# Patient Record
Sex: Female | Born: 1995 | Race: Black or African American | Hispanic: No | Marital: Single | Smoking: Former smoker
Health system: Southern US, Community
[De-identification: ages and names within clinical notes are randomized; demographics above are authoritative.]

## PROBLEM LIST (undated history)

## (undated) DIAGNOSIS — F419 Anxiety disorder, unspecified: Secondary | ICD-10-CM

---

## 2014-07-21 ENCOUNTER — Emergency Department (HOSPITAL_COMMUNITY)
Admission: EM | Admit: 2014-07-21 | Discharge: 2014-07-21 | Disposition: A | Discharge status: Home / Self Care | Payer: Self-pay | Attending: Emergency Medicine | Admitting: Emergency Medicine

## 2014-07-21 ENCOUNTER — Encounter (HOSPITAL_COMMUNITY): Payer: Self-pay | Admitting: Emergency Medicine

## 2014-07-21 DIAGNOSIS — J029 Acute pharyngitis, unspecified: Secondary | ICD-10-CM | POA: Insufficient documentation

## 2014-07-21 DIAGNOSIS — F17200 Nicotine dependence, unspecified, uncomplicated: Secondary | ICD-10-CM | POA: Insufficient documentation

## 2014-07-21 DIAGNOSIS — J069 Acute upper respiratory infection, unspecified: Secondary | ICD-10-CM | POA: Insufficient documentation

## 2014-07-21 DIAGNOSIS — B9789 Other viral agents as the cause of diseases classified elsewhere: Secondary | ICD-10-CM

## 2014-07-21 LAB — RAPID STREP SCREEN (MED CTR MEBANE ONLY): Streptococcus, Group A Screen (Direct): NEGATIVE

## 2014-07-21 MED ORDER — GUAIFENESIN ER 1200 MG PO TB12: 1.0000 | ORAL_TABLET | Freq: Two times a day (BID) | ORAL | Status: DC

## 2014-07-21 MED ORDER — IBUPROFEN 800 MG PO TABS: 800.0000 mg | ORAL_TABLET | Freq: Three times a day (TID) | ORAL | Status: DC | PRN

## 2014-07-21 MED ORDER — ACETAMINOPHEN-CODEINE 120-12 MG/5ML PO SOLN: 10.0000 mL | ORAL | Status: DC | PRN

## 2014-07-21 NOTE — Discharge Instructions (Signed)
Return here as needed.  Increase your fluid intake, rest as much as possible °

## 2014-07-21 NOTE — ED Provider Notes (Signed)
CSN: [id number]     Arrival date & time 07/21/14  1216 History  This chart was scribed for non-physician practitioner working with Toy Baker, MD by Elveria Rising, ED Scribe. This patient was seen in room WTR7/WTR7 and the patient's care was started at 1:42 PM.   Chief Complaint  Patient presents with  . Nasal Congestion  . Sore Throat  . Cough    The history is provided by the patient. No language interpreter was used.   HPI Comments: Angelica Dennis is a 18 y.o. female who presents to the Emergency Department complaining of cold symptoms including headache, rhinorrhea, sore throat, productive cough with green sputum, ongoing for two days. Patient reports treatment with Nyquil last night without relief. Patient reports subjective fever this morning; no treatment.  Recent sick contacts include her boyfriend who has had similar symptoms, ongoing for four days.   Patient denies chest pain, shortness of breath, abdominal pain or urinary symptoms.  Patient denies known allergies to medication.   History reviewed. No pertinent past medical history. History reviewed. No pertinent past surgical history. History reviewed. No pertinent family history. History  Substance Use Topics  . Smoking status: Current Every Day Smoker -- 0.50 packs/day    Types: Cigarettes  . Smokeless tobacco: Not on file  . Alcohol Use: Not on file   OB History   Grav Para Term Preterm Abortions TAB SAB Ect Mult Living                 Review of Systems A complete 10 system review of systems was obtained and all systems are negative except as noted in the HPI and PMH.    Allergies  Review of patient's allergies indicates no known allergies.  Home Medications   Prior to Admission medications   Not on File   Triage Vitals: BP 112/66  Pulse 87  Temp(Src) 99 F (37.2 C)  Resp 18  SpO2 96%  LMP 07/14/2014  Physical Exam  Nursing note and vitals reviewed. Constitutional: She is oriented to person,  place, and time. She appears well-developed and well-nourished. No distress.  HENT:  Head: Normocephalic and atraumatic.  Mouth/Throat: Oropharynx is clear and moist.  Eyes: Pupils are equal, round, and reactive to light.  Neck: Normal range of motion. Neck supple.  Mild cervical lymphadenopathy.   Cardiovascular: Normal rate, regular rhythm and normal heart sounds.  Exam reveals no gallop and no friction rub.   No murmur heard. Pulmonary/Chest: Effort normal and breath sounds normal. No respiratory distress. She has no wheezes. She has no rales.  Musculoskeletal: Normal range of motion.  Neurological: She is alert and oriented to person, place, and time.  Skin: Skin is warm and dry. She is not diaphoretic.  Psychiatric: She has a normal mood and affect. Her behavior is normal.    ED Course  Procedures (including critical care time)  COORDINATION OF CARE: 1:42 PM- Discussed treatment plan with patient at bedside and patient agreed to plan.   Labs Review Labs Reviewed  RAPID STREP SCREEN  CULTURE, GROUP A STREP    Patient be treated for viral URI.  Told to return here as needed.  Told to increase her fluid intake, and rest as much possible  I personally performed the services described in this documentation, which was scribed in my presence. The recorded information has been reviewed and is accurate.    Carlyle Dolly, PA-C 07/21/14 1407

## 2014-07-21 NOTE — ED Notes (Signed)
Pt c/o nasal congestion, sore throat, and productive cough w/ green mucous since Sunday.

## 2014-07-23 LAB — CULTURE, GROUP A STREP

## 2014-07-24 NOTE — ED Provider Notes (Signed)
Medical screening examination/treatment/procedure(s) were performed by non-physician practitioner and as supervising physician I was immediately available for consultation/collaboration.   Henry Utsey T Destenie Ingber, MD 07/24/14 0741 

## 2014-12-27 ENCOUNTER — Emergency Department (HOSPITAL_BASED_OUTPATIENT_CLINIC_OR_DEPARTMENT_OTHER)
Admission: EM | Admit: 2014-12-27 | Discharge: 2014-12-27 | Disposition: A | Discharge status: Home / Self Care | Payer: Self-pay | Attending: Emergency Medicine | Admitting: Emergency Medicine

## 2014-12-27 ENCOUNTER — Encounter (HOSPITAL_BASED_OUTPATIENT_CLINIC_OR_DEPARTMENT_OTHER): Payer: Self-pay | Admitting: *Deleted

## 2014-12-27 DIAGNOSIS — Z79899 Other long term (current) drug therapy: Secondary | ICD-10-CM | POA: Insufficient documentation

## 2014-12-27 DIAGNOSIS — Z720 Tobacco use: Secondary | ICD-10-CM | POA: Insufficient documentation

## 2014-12-27 DIAGNOSIS — H109 Unspecified conjunctivitis: Secondary | ICD-10-CM | POA: Insufficient documentation

## 2014-12-27 MED ORDER — CIPROFLOXACIN HCL 0.3 % OP SOLN
2.0000 [drp] | Freq: Once | OPHTHALMIC | Status: AC
  Administered 2014-12-27: 2 [drp] via OPHTHALMIC
  Filled 2014-12-27: qty 2.5

## 2014-12-27 MED ORDER — CIPROFLOXACIN HCL 0.3 % OP OINT: TOPICAL_OINTMENT | OPHTHALMIC | Status: DC

## 2014-12-27 NOTE — Discharge Instructions (Signed)
Follow with the eye doctor in the next 24 to 28 hours  Do not reuse your contact lenses and do not use any contact lenses until you are cleared by the eye doctor.  Apply the cipro antibiotic drops every 2 hours while you are awake for  2 days, then every 4 hours for 5 more days.    Wash your hands frequently and try to keep your hands away from the affected eye(s).   You should be feeling some improvement by 48 hours. If symptoms worsen, you develop pain, change in your vision or no improvement in 48 hours please follow with the ophthalmologist or, if that is not possible, return to the emergency room for a recheck.   Do not hesitate to return to the emergency room for any new, worsening or concerning symptoms.  Please obtain primary care using resource guide below. But the minute you were seen in the emergency room and that they will need to obtain records for further outpatient management.   Conjunctivitis Conjunctivitis is commonly called "pink eye." Conjunctivitis can be caused by bacterial or viral infection, allergies, or injuries. There is usually redness of the lining of the eye, itching, discomfort, and sometimes discharge. There may be deposits of matter along the eyelids. A viral infection usually causes a watery discharge, while a bacterial infection causes a yellowish, thick discharge. Pink eye is very contagious and spreads by direct contact. You may be given antibiotic eyedrops as part of your treatment. Before using your eye medicine, remove all drainage from the eye by washing gently with warm water and cotton balls. Continue to use the medication until you have awakened 2 mornings in a row without discharge from the eye. Do not rub your eye. This increases the irritation and helps spread infection. Use separate towels from other household members. Wash your hands with soap and water before and after touching your eyes. Use cold compresses to reduce pain and sunglasses to  relieve irritation from light. Do not wear contact lenses or wear eye makeup until the infection is gone. SEEK MEDICAL CARE IF:   Your symptoms are not better after 3 days of treatment.  You have increased pain or trouble seeing.  The outer eyelids become very red or swollen. Document Released: 12/21/2004 Document Revised: 02/05/2012 Document Reviewed: 11/13/2005 [facility] Patient Information 2015 [place], [place]. This information is not intended to replace advice given to you by your health care provider. Make sure you discuss any questions you have with your health care provider.   Emergency Department Resource Guide 1) Find a Doctor and Pay Out of Pocket Although you won't have to find out who is covered by your insurance plan, it is a good idea to ask around and get recommendations. You will then need to call the office and see if the doctor you have chosen will accept you as a new patient and what types of options they offer for patients who are self-pay. Some doctors offer discounts or will set up payment plans for their patients who do not have insurance, but you will need to ask so you aren't surprised when you get to your appointment.  2) Contact Your Local Health Department Not all health departments have doctors that can see patients for sick visits, but many do, so it is worth a call to see if yours does. If you don't know where your local health department is, you can check in your phone book. The CDC also has a tool to help  you locate your state's health department, and many state websites also have listings of all of their local health departments.  3) Find a Walk-in Clinic If your illness is not likely to be very severe or complicated, you may want to try a walk in clinic. These are popping up all over the country in pharmacies, drugstores, and shopping centers. They're usually staffed by nurse practitioners or physician assistants that have been trained to treat common illnesses  and complaints. They're usually fairly quick and inexpensive. However, if you have serious medical issues or chronic medical problems, these are probably not your best option.  No Primary Care Doctor: - Call Health Connect at  [phone] - they can help you locate a primary care doctor that  accepts your insurance, provides certain services, etc. - Physician Referral Service- [phone]  Chronic Pain Problems: Organization         Address  Phone   Notes  [facility]  [phone] Patients need to be referred by their primary care doctor.   Medication Assistance: Organization         Address  Phone   Notes  [facility] Medication [facility] [address] [place] [place], [place] [address] [phone] --Must be a resident of [facility] -- Must have NO insurance coverage whatsoever (no Medicaid/ Medicare, etc.) -- The pt. MUST have a primary care doctor that directs their care regularly and follows them in the community   [facility]  [phone]   [employer]  [phone]    Agencies that provide inexpensive medical care: Organization         Address  Phone   Notes  [facility]  [phone]   [facility]    [phone]   [facility] [address] [place], [place] [address] [phone]   [facility] [address], [place] [phone]   Planned Parenthood    [phone]   [facility]    [phone]   [facility] and [facility]  [address], [place] Phone:  [phone], Fax:  [phone] Hours of Operation:  9 am - 6 pm, M-F.  Also accepts Medicaid/Medicare and self-pay.  [facility]  [address], [place] Phone: [phone], Fax: [phone]. Hours of Operation:  8:30 am - 5:30 pm, M-F.  Also accepts Medicaid and self-pay.  [facility] [place] [address], [place] Phone: [phone]   [facility] [address] Natasha Bence [place], [place] [phone], Ext. [phone] Mondays & Thursdays: 7-9 AM.  First 15 patients are seen on a first come, first serve basis.    Medicaid-accepting [facility] Providers:  Organization         Address  Phone   Notes  [facility] [address], [place] [phone] Also accepts self-pay patients.  [facility] [address] Laurell Josephs [place], [place]  [phone]   [facility] [address], [place] [phone]   [facility] [address] [place] [phone]   Renaye Rakers [address], [place]   [phone] Only accepts [place] patients after they have their name applied to their card.   Self-Pay (no insurance) in [facility]:  Organization  Address  Phone   Notes  Sickle Cell Patients, [facility] [address], [place] [phone]   [facility] [address], [place] [phone]   [facility] [place]  [address], [place] [phone]) [phone]   [facility]/Dr. Osei-Bonsu  [address] [place] [address], [place] or [phone], [place] [phone] Phone number for both [place] and [place] locations is the same.  [facility] [address], [place] [phone]   [facility] [address] [place] [address], [place] or [address] [phone]   [facility] [address], [place] [phone], phone; [phone], fax Sees patients 1st and 3rd Saturday of every month.  Must not qualify for public or private insurance (i.e. Medicaid, Medicare, [place] Health Choice, Veterans' Benefits)  Household income should be no more than 200% of the poverty level  The clinic cannot treat you if you are pregnant or think you are pregnant  Sexually transmitted diseases are not treated at the clinic.    Dental Care: Organization         Address  Phone  Notes  [facility] [address], [place] [phone] Accepts children up to age 21 who are enrolled in [place]icaid or [place] Health Choice; pregnant women with a Medicaid card; and children who have applied for Medicaid or [place] Health Choice, but were declined, whose parents can pay a reduced fee at time of service.  [facility] [place]  [address], [place] [phone]) [phone] Accepts children up to age 21 who are enrolled in [place]icaid or  Health Choice; pregnant women with a Medicaid card; and children who have applied for Medicaid or  Health Choice, but were declined, whose parents can pay a reduced fee at time of service.  [facility]  [address], [place] [phone] Patients are seen by appointment only. Walk-ins are not accepted. [facility] will see patients 21 years of age and older. Monday - Tuesday (8am-5pm) Most Wednesdays (8:30-5pm) $30 per visit, cash only  [facility]  [address], [facility] [phone] Patients are seen by appointment only. Walk-ins are not accepted. [facility] will see patients 21 years of age and older. One Wednesday Evening (Monthly: Volunteer Based).  $30 per visit, cash only  [employer] [facility]  [phone] for adults; Children under age 294, call Graduate Pediatric Dentistry at [phone]. Children aged 704-14, please call [phone] to request a pediatric application.  Dental services are provided in all areas of dental care including fillings, crowns and bridges, complete and partial dentures, implants, gum treatment, root canals, and extractions. Preventive care is  also provided. Treatment is provided to both adults and children. Patients are selected via a lottery and there is often a waiting list.   [facility] [address], [place]  [phone] [email or web address]   [facility] [address], [place], [place] [phone], Ext. [phone] Second and Fourth Thursday of each month, opens at 6:30 AM; Clinic ends at 9 AM.  Patients are seen on a first-come first-served basis, and a limited number are seen during each clinic.   [facility]  [address], [place], [place] [phone]) [phone]   Eligibility Requirements You must have lived in [place], or [place] counties for at least the last three months.   You cannot be eligible for state or federal sponsored National Cityhealthcare insurance, including CIGNAVeterans Administration, [place]icaid, or [employer]Medicare.   You generally cannot be eligible for healthcare insurance through your employer.    How to apply: Eligibility screenings are held every Tuesday and Wednesday afternoon from 1:00 pm until 4:00 pm. You do not need an appointment for the interview!  [facility] [address], [place], [place] [phone]   [facility]  [phone]   [facility]  [phone]   [facility]  [phone]    Behavioral Health Resources in the Community: Intensive Outpatient Programs Organization         Address  Phone  Notes  [facility] [address], [place], [place] [phone]   [facility] [address], [place], [place] [phone]   [facility] [address], [place], [place]  [phone]   [facility] [address],  [place], [place] [phone] or [phone]   Substance Abuse Resources Organization         Address  Phone  Notes  [facility]  [phone]   [facility]  [phone]   [facility]  [phone]   [facility]  [phone]   Residential & Outpatient Substance Abuse Program  [phone]   Psychological Services Organization         Address  Phone  Notes  [facility]  [phone]- [phone]   [facility]  [phone]- [phone]   [facility] [address], [place] [phone] or [phone]    Mobile Crisis Teams Organization         Address  Phone  Notes  [facility], Mobile Crisis Care Unit  [phone]   [facility]  [address] [place], [place] [phone]   Doristine LocksSharon DeEsch [address] [place] [phone]    Self-Help/Support Groups Organization         Address  Phone             Notes  [facility] [place] - variety of support groups  [phone]- [phone] Call for more information  Narcotics Anonymous (NA), [facility] [address], [place]  2 meetings at this location   Statisticianesidential Treatment Programs Organization         Address  Phone  Notes  [facility] [address],    [place]  [phone]   [facility]  [address], [place] [phone], [place], [place] [phone]   [facility] [address], [place] [phone] Admissions: 8am-3pm M-F  [facility] [address]    [place], [place] [phone]   [facility] [address], [place], [place] [phone]   [facility] [address]  [place], [place] [phone]   [facility] - Intensive Outpatient [address], [place], [place] [phone]   [facility] ([facility] [address]  [place], [place] [phone] or [phone]   [facility] ([facility]) [address] [place], [place] [phone] Accepts Medicaid  [facility] [address]  [place] [phone]  Substance Abuse/Addiction Treatment   [facility] Resources Organization  Address  Phone  Notes  °[facility]  [phone]   °Julie Brannon, PhD [address] [place], [place]   ([phone]) [phone] or (336) [phone]   °[facility]   [address] °[place], [place] ([phone]) [phone]   °[facility] [address], [place], [place] ([phone] Insurance/Medicaid/sponsorship through [facility]  °[facility] [address]                                    [place], [place] [phone] Therapy/tele-psych/case  °[facility] [address]  ° [place], [place] [phone]    °Dr. Arfeen  ([phone]   °[facility] [place]  [employer] [place] [facility] 1) [address], [place] °2) [address], [place] °3)  [address], [place] [phone]) [phone] °([phone]) [phone] ° °([phone]) [phone]   °[place] Child Abuse Hotline (336) [phone] or (336) [phone] (After Hours)    ° ° ° °

## 2014-12-27 NOTE — ED Notes (Signed)
Right eye red and swollen

## 2014-12-27 NOTE — ED Provider Notes (Signed)
CSN: [id number]     Arrival date & time 12/27/14  1411 History   First MD Initiated Contact with Patient 12/27/14 1457     Chief Complaint  Patient presents with  . Eye Problem     (Consider location/radiation/quality/duration/timing/severity/associated sxs/prior Treatment) HPI   Angelica Dennis is a 19 y.o. female complaining of redness and irritation to left eye which she noticed this morning upon waking. States her eye was crusted shut. She sent from work for evaluation of possible conjunctivitis. Patient denies contact lens use, decreased visual acuity, trauma to the eye, sick contacts.  History reviewed. No pertinent past medical history. History reviewed. No pertinent past surgical history. No family history on file. History  Substance Use Topics  . Smoking status: Current Every Day Smoker -- 0.50 packs/day    Types: Cigarettes  . Smokeless tobacco: Not on file  . Alcohol Use: No   OB History    No data available     Review of Systems  10 systems reviewed and found to be negative, except as noted in the HPI.   Allergies  Review of patient's allergies indicates no known allergies.  Home Medications   Prior to Admission medications   Medication Sig Start Date End Date Taking? Authorizing Provider  acetaminophen-codeine 120-12 MG/5ML solution Take 10 mLs by mouth every 4 (four) hours as needed for moderate pain. 07/21/14   Jamesetta Orleanshristopher W Lawyer, PA-C  Guaifenesin 1200 MG TB12 Take 1 tablet (1,200 mg total) by mouth 2 (two) times daily. 07/21/14   Jamesetta Orleanshristopher W Lawyer, PA-C  ibuprofen (ADVIL,MOTRIN) 800 MG tablet Take 1 tablet (800 mg total) by mouth every 8 (eight) hours as needed. 07/21/14   Jamesetta Orleanshristopher W Lawyer, PA-C   BP 125/67 mmHg  Pulse 85  Temp(Src) 98.7 F (37.1 C) (Oral)  Resp 16  Ht 5\' 10"  (1.778 m)  Wt 135 lb (61.236 kg)  BMI 19.37 kg/m2  SpO2 100% Physical Exam  Constitutional: She is oriented to person, place, and time. She appears well-developed and  well-nourished. No distress.  HENT:  Head: Normocephalic and atraumatic.  Mouth/Throat: Oropharynx is clear and moist.  Eyes: EOM are normal. Pupils are equal, round, and reactive to light.  Right eye is slightly injected, pupils are equal round and reactive to light, there is no lip swelling, extraocular movement is intact. There is slight crusting along the eyelashes.  Neck: Normal range of motion. Neck supple.  Cardiovascular: Normal rate, regular rhythm and intact distal pulses.   Pulmonary/Chest: Effort normal. No stridor.  Musculoskeletal: Normal range of motion.  Neurological: She is alert and oriented to person, place, and time.  Psychiatric: She has a normal mood and affect.  Nursing note and vitals reviewed.   ED Course  Procedures (including critical care time) Labs Review Labs Reviewed - No data to display  Imaging Review No results found.   EKG Interpretation None      MDM   Final diagnoses:  Conjunctivitis of right eye    Filed Vitals:   12/27/14 1417  BP: 125/67  Pulse: 85  Temp: 98.7 F (37.1 C)  TempSrc: Oral  Resp: 16  Height: 5\' 10"  (1.778 m)  Weight: 135 lb (61.236 kg)  SpO2: 100%    Medications  ciprofloxacin (CILOXAN) 0.3 % ophthalmic solution 2 drop (2 drops Right Eye Given 12/27/14 1522)    Angelica Dennis is a pleasant 19 y.o. female presenting with a right-sided conjunctivitis. Not a contact lens wearer. Will write her for Cipro.  Evaluation does not show pathology that would require ongoing emergent intervention or inpatient treatment. Pt is hemodynamically stable and mentating appropriately. Discussed findings and plan with patient/guardian, who agrees with care plan. All questions answered. Return precautions discussed and outpatient follow up given.   Discharge Medication List as of 12/27/2014  3:08 PM    START taking these medications   Details  ciprofloxacin (CILOXAN) 0.3 % ophthalmic ointment 1-2 drops in affected eye every 2  hours while awake for 2 days then every 4 hours while awake for 5 days., Print             Wynetta Emery, PA-C 12/28/14 1819  Candyce Churn III, MD 12/31/14 [phone]

## 2015-04-27 ENCOUNTER — Encounter (HOSPITAL_BASED_OUTPATIENT_CLINIC_OR_DEPARTMENT_OTHER): Payer: Self-pay | Admitting: *Deleted

## 2015-04-27 ENCOUNTER — Emergency Department (HOSPITAL_BASED_OUTPATIENT_CLINIC_OR_DEPARTMENT_OTHER)
Admission: EM | Admit: 2015-04-27 | Discharge: 2015-04-27 | Discharge status: Against Medical Advice | Payer: Self-pay | Attending: Emergency Medicine | Admitting: Emergency Medicine

## 2015-04-27 DIAGNOSIS — F410 Panic disorder [episodic paroxysmal anxiety] without agoraphobia: Secondary | ICD-10-CM | POA: Insufficient documentation

## 2015-04-27 DIAGNOSIS — Z720 Tobacco use: Secondary | ICD-10-CM | POA: Insufficient documentation

## 2015-04-27 NOTE — ED Notes (Signed)
Pt brought in by EMS for anxiety x 1 hr

## 2015-04-27 NOTE — ED Notes (Signed)
Pt resting quietly in lobby with family

## 2015-04-27 NOTE — ED Notes (Signed)
Father states pt is " feeling better" and is taking daughter home

## 2015-07-05 ENCOUNTER — Encounter (HOSPITAL_BASED_OUTPATIENT_CLINIC_OR_DEPARTMENT_OTHER): Payer: Self-pay | Admitting: Emergency Medicine

## 2015-07-05 ENCOUNTER — Emergency Department (HOSPITAL_BASED_OUTPATIENT_CLINIC_OR_DEPARTMENT_OTHER)
Admission: EM | Admit: 2015-07-05 | Discharge: 2015-07-05 | Disposition: A | Discharge status: Home / Self Care | Payer: 59 | Attending: Emergency Medicine | Admitting: Emergency Medicine

## 2015-07-05 ENCOUNTER — Emergency Department (HOSPITAL_BASED_OUTPATIENT_CLINIC_OR_DEPARTMENT_OTHER): Payer: 59

## 2015-07-05 DIAGNOSIS — S6991XA Unspecified injury of right wrist, hand and finger(s), initial encounter: Secondary | ICD-10-CM | POA: Diagnosis present

## 2015-07-05 DIAGNOSIS — Y9383 Activity, rough housing and horseplay: Secondary | ICD-10-CM | POA: Diagnosis not present

## 2015-07-05 DIAGNOSIS — Y998 Other external cause status: Secondary | ICD-10-CM | POA: Diagnosis not present

## 2015-07-05 DIAGNOSIS — S60221A Contusion of right hand, initial encounter: Secondary | ICD-10-CM

## 2015-07-05 DIAGNOSIS — Y929 Unspecified place or not applicable: Secondary | ICD-10-CM | POA: Diagnosis not present

## 2015-07-05 DIAGNOSIS — Z720 Tobacco use: Secondary | ICD-10-CM | POA: Diagnosis not present

## 2015-07-05 DIAGNOSIS — W228XXA Striking against or struck by other objects, initial encounter: Secondary | ICD-10-CM | POA: Insufficient documentation

## 2015-07-05 MED ORDER — IBUPROFEN 400 MG PO TABS
600.0000 mg | ORAL_TABLET | Freq: Once | ORAL | Status: AC
  Administered 2015-07-05: 600 mg via ORAL
  Filled 2015-07-05 (×2): qty 1

## 2015-07-05 NOTE — ED Provider Notes (Signed)
CSN: [id number]     Arrival date & time 07/05/15  1714 History  This chart was scribed for Richardean Canal, MD by Lyndel Safe, ED Scribe. This patient was seen in room MH10/MH10 and the patient's care was started 6:22 PM.   Chief Complaint  Patient presents with  . Hand Injury   The history is provided by the patient. No language interpreter was used.   HPI Comments: Angelica Dennis is a 19 y.o. female who presents to the Emergency Department complaining of gradual onset, constant, moderate right wrist pain and right dorsum of hand pain s/p blunt force injury 1 day ago. Pt reports she accidentally hit herself in the mouth with her right hand yesterday while trying to horse play with boyfriend. Denies jaw pain, just wrist and hand pain.   History reviewed. No pertinent past medical history. History reviewed. No pertinent past surgical history. No family history on file. History  Substance Use Topics  . Smoking status: Current Every Day Smoker -- 0.50 packs/day    Types: Cigarettes  . Smokeless tobacco: Not on file  . Alcohol Use: No   OB History    No data available     Review of Systems  Musculoskeletal: Positive for myalgias and arthralgias.  All other systems reviewed and are negative.  Allergies  Review of patient's allergies indicates no known allergies.  Home Medications   Prior to Admission medications   Not on File   BP 150/88 mmHg  Pulse 82  Temp(Src) 98.1 F (36.7 C) (Oral)  Resp 16  Ht  (1.753 m)  Wt 120 lb (54.432 kg)  BMI 17.71 kg/m2  SpO2 100%  LMP 05/24/2015 (Approximate) Physical Exam  Constitutional: She appears well-developed and well-nourished. No distress.  HENT:  Head: Normocephalic.  No tenderness on jaw.   Eyes: Conjunctivae are normal. Right eye exhibits no discharge. Left eye exhibits no discharge. No scleral icterus.  Neck: No JVD present.  Pulmonary/Chest: Effort normal. No respiratory distress.  Musculoskeletal: Normal range of  motion. She exhibits tenderness.  Minimal tenderness on radial aspect of right wrist as well as along the right 2nd metacarpal but no obvious deformity, 2+ radial pulses, good capillary refill, normal ROM of right wrist and hand.   Neurological: She is alert. Coordination normal.  Skin: Skin is warm. No rash noted. No erythema. No pallor.  Psychiatric: She has a normal mood and affect. Her behavior is normal.  Nursing note and vitals reviewed.   ED Course  Procedures  DIAGNOSTIC STUDIES: Oxygen Saturation is 100% on RA, normal by my interpretation.    COORDINATION OF CARE: 6:24 PM Discussed treatment plan which includes to order  Motrin with pt. Discussed unremarkable right hand Xray imaging with pt. Pt acknowledges and agrees to plan.   Labs Review Labs Reviewed - No data to display  Imaging Review Dg Hand Complete Right  07/05/2015   CLINICAL DATA:  [name] to the right hand 07/04/2015. Pain. Initial encounter.  EXAM: RIGHT HAND - COMPLETE 3+ VIEW  COMPARISON:  None.  FINDINGS: Imaged bones, joints and soft tissues appear normal.  IMPRESSION: Normal exam.   Electronically Signed   By: Drusilla Kanner M.D.   On: 07/05/2015 17:57     EKG Interpretation None      MDM   Final diagnoses:  None   Angelica Dennis is a 19 y.o. female here with R hand injury. Likely contusion. Will get xray and give motrin.  6:31 PM Xray showed no  fracture. Will dc home with motrin. Given splint for comfort.   I personally performed the services described in this documentation, which was scribed in my presence. The recorded information has been reviewed and is accurate.    Richardean Canal, MD 07/05/15 [phone]

## 2015-07-05 NOTE — Discharge Instructions (Signed)
Take motrin every 6 hrs for pain.   Use splint for comfort.   No heavy lifting for a week.   See your doctor.   Return to ER if you have severe pain, worse hand swelling.

## 2015-07-05 NOTE — ED Notes (Signed)
Hit self in face with right hand yesterday during horseplay.  Pain to back of hand and wrist today.

## 2015-07-05 NOTE — ED Notes (Signed)
MD at bedside. 

## 2017-01-08 ENCOUNTER — Emergency Department (HOSPITAL_COMMUNITY): Payer: 59

## 2017-01-08 ENCOUNTER — Emergency Department (HOSPITAL_COMMUNITY)
Admission: EM | Admit: 2017-01-08 | Discharge: 2017-01-08 | Disposition: A | Discharge status: Home / Self Care | Payer: 59 | Attending: Emergency Medicine | Admitting: Emergency Medicine

## 2017-01-08 ENCOUNTER — Encounter (HOSPITAL_COMMUNITY): Payer: Self-pay | Admitting: Emergency Medicine

## 2017-01-08 DIAGNOSIS — F419 Anxiety disorder, unspecified: Secondary | ICD-10-CM | POA: Insufficient documentation

## 2017-01-08 DIAGNOSIS — F17210 Nicotine dependence, cigarettes, uncomplicated: Secondary | ICD-10-CM | POA: Insufficient documentation

## 2017-01-08 HISTORY — DX: Anxiety disorder, unspecified: F41.9

## 2017-01-08 LAB — POC URINE PREG, ED: PREG TEST UR: NEGATIVE

## 2017-01-08 NOTE — ED Provider Notes (Signed)
[facility]-EMERGENCY DEPT Provider Note   CSN: [id number] Arrival date & time: 01/08/17  1537     History   Chief Complaint Chief Complaint  Patient presents with  . Anxiety    HPI Angelica Dennis is a 21 y.o. female.  Patient is a 21 year old female with a history of anxiety presents after a panic attack. She states she's been having chest pain throughout the day. It started this morning. Send the center of her chest. Nonradiating. No shortness of breath. She was thinking about it all day and while she was at work she had a panic attack. She got very anxious and short of breath during the panic attack. She is feeling better now. She states her chest still hurts but other than that feels back to normal. She denies any drug use. She does drink alcohol occasionally on the weekends. She denies any cough or cold symptoms.      Past Medical History:  Diagnosis Date  . Anxiety     There are no active problems to display for this patient.   History reviewed. No pertinent surgical history.  OB History    No data available       Home Medications    Prior to Admission medications   Not on File    Family History No family history on file.  Social History Social History  Substance Use Topics  . Smoking status: Current Every Day Smoker    Packs/day: 0.50    Types: Cigarettes  . Smokeless tobacco: Never Used  . Alcohol use No     Allergies   Patient has no known allergies.   Review of Systems Review of Systems  Constitutional: Negative for chills, diaphoresis, fatigue and fever.  HENT: Negative for congestion, rhinorrhea and sneezing.   Eyes: Negative.   Respiratory: Negative for cough, chest tightness and shortness of breath.   Cardiovascular: Positive for chest pain. Negative for leg swelling.  Gastrointestinal: Negative for abdominal pain, blood in stool, diarrhea, nausea and vomiting.  Genitourinary: Negative for difficulty urinating, flank pain, frequency and  hematuria.  Musculoskeletal: Negative for arthralgias and back pain.  Skin: Negative for rash.  Neurological: Negative for dizziness, speech difficulty, weakness, numbness and headaches.  Psychiatric/Behavioral: Negative for suicidal ideas. The patient is nervous/anxious.      Physical Exam Updated Vital Signs Pulse 84   Temp 98.3 F (36.8 C) (Oral)   Resp 20   LMP 01/03/2017   SpO2 100%   Physical Exam  Constitutional: She is oriented to person, place, and time. She appears well-developed and well-nourished.  HENT:  Head: Normocephalic and atraumatic.  Eyes: Pupils are equal, round, and reactive to light.  Neck: Normal range of motion. Neck supple.  Cardiovascular: Normal rate, regular rhythm and normal heart sounds.   Pulmonary/Chest: Effort normal and breath sounds normal. No respiratory distress. She has no wheezes. She has no rales. She exhibits tenderness (tenderness to palpation in center of chest).  Abdominal: Soft. Bowel sounds are normal. There is no tenderness. There is no rebound and no guarding.  Musculoskeletal: Normal range of motion. She exhibits no edema.  Lymphadenopathy:    She has no cervical adenopathy.  Neurological: She is alert and oriented to person, place, and time.  Skin: Skin is warm and dry. No rash noted.  Psychiatric: She has a normal mood and affect.     ED Treatments / Results  Labs (all labs ordered are listed, but only abnormal results are displayed) Labs Reviewed  POC  URINE PREG, ED    EKG  EKG Interpretation None      ED ECG REPORT   Date: 01/08/2017  Rate: 67  Rhythm: normal sinus rhythm  QRS Axis: normal  Intervals: normal  ST/T Wave abnormalities: normal  Conduction Disutrbances:none  Narrative Interpretation:   Old EKG Reviewed: none available  I have personally reviewed the EKG tracing and agree with the computerized printout as noted.  Radiology Dg Chest 2 View  Result Date: 01/08/2017 CLINICAL DATA:  Initial  encounter. 21 y/o female with hx anxiety attacks states she had a panic attack today, also c/o sternal CP and headache. Hx chronic SOB and cough - states this is probably due to her being a current smoker. EXAM: CHEST  2 VIEW COMPARISON:  None. FINDINGS: The heart size and mediastinal contours are within normal limits. Both lungs are clear. The visualized skeletal structures are unremarkable. IMPRESSION: No active cardiopulmonary disease. Electronically Signed   By: Norva Pavlov M.D.   On: 01/08/2017 17:39    Procedures Procedures (including critical care time)  Medications Ordered in ED Medications - No data to display   Initial Impression / Assessment and Plan / ED Course  I have reviewed the triage vital signs and the nursing notes.  Pertinent labs & imaging results that were available during my care of the patient were reviewed by me and considered in my medical decision making (see chart for details).     Patient presents with panic attack. She is well-appearing now. Her symptoms have subsided. She has associated chest pain. It's reproducible on palpation. I feel like it's musculoskeletal in nature. She was advised to use ibuprofen for symptomatically. Her chest x-ray is normal with no suggestions of pneumothorax or pneumonia. Her EKG does not show evidence of ischemia. Final Clinical Impressions(s) / ED Diagnoses   Final diagnoses:  Anxiety    New Prescriptions New Prescriptions   No medications on file     Rolan Bucco, MD 01/08/17 1807

## 2017-01-08 NOTE — ED Triage Notes (Signed)
Patient with a history of anxiety started having panic attacks today at work.  Her cousin was coming to pick her up but did not show up so she called 911.  Patient has been treated for anxiety in the past, but went off her meds four years ago.

## 2017-12-21 ENCOUNTER — Emergency Department (HOSPITAL_COMMUNITY): Payer: 59

## 2017-12-21 ENCOUNTER — Emergency Department (HOSPITAL_COMMUNITY)
Admission: EM | Admit: 2017-12-21 | Discharge: 2017-12-21 | Disposition: A | Discharge status: Home / Self Care | Payer: 59 | Attending: Emergency Medicine | Admitting: Emergency Medicine

## 2017-12-21 DIAGNOSIS — F419 Anxiety disorder, unspecified: Secondary | ICD-10-CM | POA: Diagnosis present

## 2017-12-21 DIAGNOSIS — F17210 Nicotine dependence, cigarettes, uncomplicated: Secondary | ICD-10-CM | POA: Insufficient documentation

## 2017-12-21 DIAGNOSIS — Y907 Blood alcohol level of 200-239 mg/100 ml: Secondary | ICD-10-CM | POA: Insufficient documentation

## 2017-12-21 DIAGNOSIS — F329 Major depressive disorder, single episode, unspecified: Secondary | ICD-10-CM | POA: Insufficient documentation

## 2017-12-21 DIAGNOSIS — R05 Cough: Secondary | ICD-10-CM | POA: Insufficient documentation

## 2017-12-21 DIAGNOSIS — F10920 Alcohol use, unspecified with intoxication, uncomplicated: Secondary | ICD-10-CM | POA: Insufficient documentation

## 2017-12-21 LAB — RAPID URINE DRUG SCREEN, HOSP PERFORMED
AMPHETAMINES: NOT DETECTED
Barbiturates: NOT DETECTED
Benzodiazepines: NOT DETECTED
Cocaine: NOT DETECTED
OPIATES: NOT DETECTED
Tetrahydrocannabinol: NOT DETECTED

## 2017-12-21 LAB — COMPREHENSIVE METABOLIC PANEL
ALT: 12 U/L — ABNORMAL LOW (ref 14–54)
AST: 19 U/L (ref 15–41)
Albumin: 4.4 g/dL (ref 3.5–5.0)
Alkaline Phosphatase: 51 U/L (ref 38–126)
Anion gap: 10 (ref 5–15)
BUN: 9 mg/dL (ref 6–20)
CHLORIDE: 108 mmol/L (ref 101–111)
CO2: 24 mmol/L (ref 22–32)
Calcium: 9.4 mg/dL (ref 8.9–10.3)
Creatinine, Ser: 0.82 mg/dL (ref 0.44–1.00)
GFR calc non Af Amer: >60 mL/min (ref >60)
Glucose, Bld: 94 mg/dL (ref 65–99)
Potassium: 3.3 mmol/L — ABNORMAL LOW (ref 3.5–5.1)
SODIUM: 142 mmol/L (ref 135–145)
Total Bilirubin: 0.3 mg/dL (ref 0.3–1.2)
Total Protein: 8.4 g/dL — ABNORMAL HIGH (ref 6.5–8.1)

## 2017-12-21 LAB — CBC
HCT: 36.3 % (ref 36.0–46.0)
Hemoglobin: 12.5 g/dL (ref 12.0–15.0)
MCH: 31.4 pg (ref 26.0–34.0)
MCHC: 34.4 g/dL (ref 30.0–36.0)
MCV: 91.2 fL (ref 78.0–100.0)
PLATELETS: 246 10*3/uL (ref 150–400)
RBC: 3.98 MIL/uL (ref 3.87–5.11)
RDW: 13.8 % (ref 11.5–15.5)
WBC: 7.1 10*3/uL (ref 4.0–10.5)

## 2017-12-21 LAB — SALICYLATE LEVEL: Salicylate Lvl: <7 mg/dL (ref 2.8–30.0)

## 2017-12-21 LAB — PREGNANCY, URINE: Preg Test, Ur: NEGATIVE

## 2017-12-21 LAB — ETHANOL: ALCOHOL ETHYL (B): 224 mg/dL — AB (ref <10)

## 2017-12-21 LAB — ACETAMINOPHEN LEVEL: Acetaminophen (Tylenol), Serum: <10 ug/mL — ABNORMAL LOW (ref 10–30)

## 2017-12-21 MED ORDER — HYDROXYZINE HCL 25 MG PO TABS: 25.0000 mg | ORAL_TABLET | Freq: Four times a day (QID) | ORAL | Status: DC | PRN

## 2017-12-21 MED ORDER — HYDROXYZINE HCL 25 MG PO TABS: 25.0000 mg | ORAL_TABLET | Freq: Four times a day (QID) | ORAL | 0 refills | Status: DC | PRN
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
## 2017-12-21 NOTE — ED Provider Notes (Signed)
EKG with sinus rhythm, sinus arrhythmia, rate 75, artifact, nonspecific T wave changes, abnormal   Gerhard MunchLockwood, Macaulay Reicher, MD 12/21/17 1621

## 2017-12-21 NOTE — ED Notes (Signed)
Bed: WA28 Expected date:  Expected time:  Means of arrival:  Comments: EMS-anxiety 

## 2017-12-21 NOTE — [facility] Suicide Risk Assessment (Signed)
Suicide Risk Assessment  Discharge Assessment   [facility]   Principal Problem: Anxiety Discharge Diagnoses:  Patient Active Problem List   Diagnosis Date Noted  . Anxiety [F41.9] 12/21/2017    Priority: High    Total Time spent with patient: 45 minutes  Musculoskeletal: Strength & Muscle Tone: within normal limits Gait & Station: normal Patient leans: N/A  Psychiatric Specialty Exam:   Blood pressure 111/64, pulse 86, temperature 98.2 F (36.8 C), temperature source Oral, resp. rate 16, SpO2 100 %.There is no height or weight on file to calculate BMI.  General Appearance: Casual  Eye Contact::  Good  Speech:  Normal Rate409  Volume:  Normal  Mood:  Anxious  Affect:  Congruent  Thought Process:  Coherent and Descriptions of Associations: Intact  Orientation:  Full (Time, Place, and Person)  Thought Content:  WDL and Logical  Suicidal Thoughts:  No  Homicidal Thoughts:  No  Memory:  Immediate;   Good Recent;   Good Remote;   Good  Judgement:  Good  Insight:  Good  Psychomotor Activity:  Normal  Concentration:  Good  Recall:  Good  Fund of Knowledge:Good  Language: Good  Akathisia:  No  Handed:  Right  AIMS (if indicated):     Assets:  Housing Leisure Time Physical Health Resilience Social Support  Sleep:     Cognition: WNL  ADL's:  Intact   Mental Status Per Nursing Assessment::   On Admission:   22 yo female who presented to the ED with anxiety.  Her roommate is with her and supportive.  No suicidal/homicidal ideations, hallucinations, or substance abuse.  She is interested in starting medication and going to therapy.  [facility]  Stable for discharge.  Demographic Factors:  Adolescent or young adult  Loss Factors: NA  Historical Factors: NA  Risk Reduction Factors:   Sense of responsibility to family, Employed, Living with another person, especially a relative and  Positive social support  Continued Clinical Symptoms:  Anxiety, mild  Cognitive Features That Contribute To Risk:  None    Suicide Risk:  Minimal: No identifiable suicidal ideation.  Patients presenting with no risk factors but with morbid ruminations; may be classified as minimal risk based on the severity of the depressive symptoms  Follow-up Information    [facility] Follow up.   Specialty:  Professional Counselor Why:  Please follow up for outpatient resouces  Contact information: [facility] [address] [place] [id number] [phone]           Plan Of Care/Follow-up recommendations:  Activity:  as tolerated Diet:  heart healthy diet  Pamila Mendibles, NP 12/21/2017, 11:18 AM
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
## 2018-03-01 ENCOUNTER — Emergency Department (HOSPITAL_COMMUNITY)
Admission: EM | Admit: 2018-03-01 | Discharge: 2018-03-01 | Disposition: A | Discharge status: Home / Self Care | Payer: 59 | Attending: Emergency Medicine | Admitting: Emergency Medicine

## 2018-03-01 ENCOUNTER — Other Ambulatory Visit: Payer: Self-pay

## 2018-03-01 ENCOUNTER — Encounter (HOSPITAL_COMMUNITY): Payer: Self-pay

## 2018-03-01 DIAGNOSIS — R112 Nausea with vomiting, unspecified: Secondary | ICD-10-CM | POA: Insufficient documentation

## 2018-03-01 DIAGNOSIS — F419 Anxiety disorder, unspecified: Secondary | ICD-10-CM | POA: Insufficient documentation

## 2018-03-01 DIAGNOSIS — G44209 Tension-type headache, unspecified, not intractable: Secondary | ICD-10-CM | POA: Insufficient documentation

## 2018-03-01 DIAGNOSIS — F17210 Nicotine dependence, cigarettes, uncomplicated: Secondary | ICD-10-CM | POA: Insufficient documentation

## 2018-03-01 MED ORDER — METOCLOPRAMIDE HCL 5 MG/ML IJ SOLN
10.0000 mg | Freq: Once | INTRAMUSCULAR | Status: AC
  Administered 2018-03-01: 10 mg via INTRAMUSCULAR
  Filled 2018-03-01: qty 2

## 2018-03-01 MED ORDER — DIPHENHYDRAMINE HCL 50 MG/ML IJ SOLN
25.0000 mg | Freq: Once | INTRAMUSCULAR | Status: AC
  Administered 2018-03-01: 25 mg via INTRAMUSCULAR
  Filled 2018-03-01: qty 1

## 2018-03-01 MED ORDER — KETOROLAC TROMETHAMINE 30 MG/ML IJ SOLN
30.0000 mg | Freq: Once | INTRAMUSCULAR | Status: AC
  Administered 2018-03-01: 30 mg via INTRAMUSCULAR
  Filled 2018-03-01: qty 1

## 2018-03-01 NOTE — ED Notes (Signed)
Pt given ginger ale.

## 2018-03-01 NOTE — ED Notes (Signed)
Pt is alert and oriented x 4 and is verbally responsive. Pt reports that she has had n/v with approx. 6 x in the past 24 hours/. Pt denies diarrhea. Pt is c/o HA 9/10 that began prior to n/v.

## 2018-03-01 NOTE — ED Provider Notes (Signed)
[facility]-EMERGENCY DEPT Provider Note   CSN: [id number] Arrival date & time: 03/01/18  1853     History   Chief Complaint Chief Complaint  Patient presents with  . Panic Attack  . Numbness    HPI Angelica Dennis is a 22 y.o. female.  Patient is a 22 year old female with a history of panic attacks who presents with anxiety.  She feels shaky and feels like she is having a panic attack.  She also has a migraine.  She states she has a history of migraines and has had gradual worsening of a headache that started about 1:00 this afternoon.  She describes it as a pressure feeling across the front of her head and bitemporal area.  No pain in her neck.  No vision changes.  No photophobia.  She has had some associated nausea and vomiting.  She does have a history of migraines but says this is worse than her typical migraine.  She denies any fevers.  No recent head trauma.  No syncope.  She denies any suicidal or homicidal ideations.     Past Medical History:  Diagnosis Date  . Anxiety     Patient Active Problem List   Diagnosis Date Noted  . Anxiety 12/21/2017    History reviewed. No pertinent surgical history.   OB History   None      Home Medications    Prior to Admission medications   Medication Sig Start Date End Date Taking? Authorizing Provider  hydrOXYzine (ATARAX/VISTARIL) 25 MG tablet Take 1 tablet (25 mg total) by mouth every 6 (six) hours as needed for anxiety. Patient not taking: Reported on 03/01/2018 12/21/17   Charm RingsLord, Jamison Y, NP    Family History History reviewed. No pertinent family history.  Social History Social History   Tobacco Use  . Smoking status: Current Every Day Smoker    Packs/day: 0.50    Types: Cigarettes  . Smokeless tobacco: Never Used  Substance Use Topics  . Alcohol use: No  . Drug use: No     Allergies   Patient has no known allergies.   Review of Systems Review of Systems  Constitutional: Negative  for chills, diaphoresis, fatigue and fever.  HENT: Negative for congestion, rhinorrhea and sneezing.   Eyes: Negative.   Respiratory: Negative for cough, chest tightness and shortness of breath.   Cardiovascular: Negative for chest pain and leg swelling.  Gastrointestinal: Positive for nausea and vomiting. Negative for abdominal pain, blood in stool and diarrhea.  Genitourinary: Negative for difficulty urinating, flank pain, frequency and hematuria.  Musculoskeletal: Negative for arthralgias and back pain.  Skin: Negative for rash.  Neurological: Positive for headaches. Negative for dizziness, speech difficulty, weakness and numbness.  Psychiatric/Behavioral: Positive for agitation. The patient is nervous/anxious.      Physical Exam Updated Vital Signs BP 111/69 (BP Location: Left Arm)   Pulse 98   Temp 98.6 F (37 C) (Oral)   Resp 16   Ht 5\' 9"  (1.753 m)   Wt 54.4 kg (120 lb)   SpO2 100%   BMI 17.72 kg/m   Physical Exam  Constitutional: She is oriented to person, place, and time. She appears well-developed and well-nourished.  HENT:  Head: Normocephalic and atraumatic.  Eyes: Pupils are equal, round, and reactive to light.  No photophobia, fundi were not well visualized  Neck: Normal range of motion. Neck supple.  No meningismus  Cardiovascular: Normal rate, regular rhythm and normal heart sounds.  Pulmonary/Chest: Effort normal  and breath sounds normal. No respiratory distress. She has no wheezes. She has no rales. She exhibits no tenderness.  Abdominal: Soft. Bowel sounds are normal. There is no tenderness. There is no rebound and no guarding.  Musculoskeletal: Normal range of motion. She exhibits no edema.  Lymphadenopathy:    She has no cervical adenopathy.  Neurological: She is alert and oriented to person, place, and time.  Motor 5/5 all extremities Sensation grossly intact to LT all extremities Finger to Nose intact, no pronator drift CN II-XII grossly intact      Skin: Skin is warm and dry. No rash noted.  Psychiatric: She has a normal mood and affect.     ED Treatments / Results  Labs (all labs ordered are listed, but only abnormal results are displayed) Labs Reviewed - No data to display  EKG None  Radiology No results found.  Procedures Procedures (including critical care time)  Medications Ordered in ED Medications  metoCLOPramide (REGLAN) injection 10 mg (10 mg Intramuscular Given 03/01/18 2054)  diphenhydrAMINE (BENADRYL) injection 25 mg (25 mg Intramuscular Given 03/01/18 2054)  ketorolac (TORADOL) 30 MG/ML injection 30 mg (30 mg Intramuscular Given 03/01/18 2054)     Initial Impression / Assessment and Plan / ED Course  I have reviewed the triage vital signs and the nursing notes.  Pertinent labs & imaging results that were available during my care of the patient were reviewed by me and considered in my medical decision making (see chart for details).     Patient is a 22 year old female who presents with anxiety/panic attack as well as a headache.  She has a bifrontal type headache.  There is no nuchal rigidity.  No recent head trauma.  No photophobia.  No other symptoms that would be more concerning for subarachnoid hemorrhage or meningitis.  She was given a migraine cocktail and is feeling much better after this.  Her anxiety has improved.  She was discharged home in good condition.  Return precautions were given.  Final Clinical Impressions(s) / ED Diagnoses   Final diagnoses:  Anxiety  Tension-type headache, not intractable, unspecified chronicity pattern    ED Discharge Orders    None       Rolan Bucco, MD 03/01/18 2227

## 2018-03-01 NOTE — ED Triage Notes (Signed)
Pt with a hx of anxiety reports bilateral intermittent leg numbness, shaking, tachypnea, and headache. She reports that she has had panic attacks in the past. She is crying in triage, but is able to speak in complete sentences and is able to be calmed down with verbal reassurance. A&Ox4.

## 2018-07-14 IMAGING — CR DG CHEST 2V
2 series · 2 of 2 positions shown · non-contrast
Comparison: None.

CLINICAL DATA: Initial encounter. 20 y/o female with hx anxiety
attacks states she had a panic attack today, also c/o sternal CP and
headache. Hx chronic SOB and cough - states this is probably due to
her being a current smoker.

EXAM:
CHEST  2 VIEW

[w chest pa]
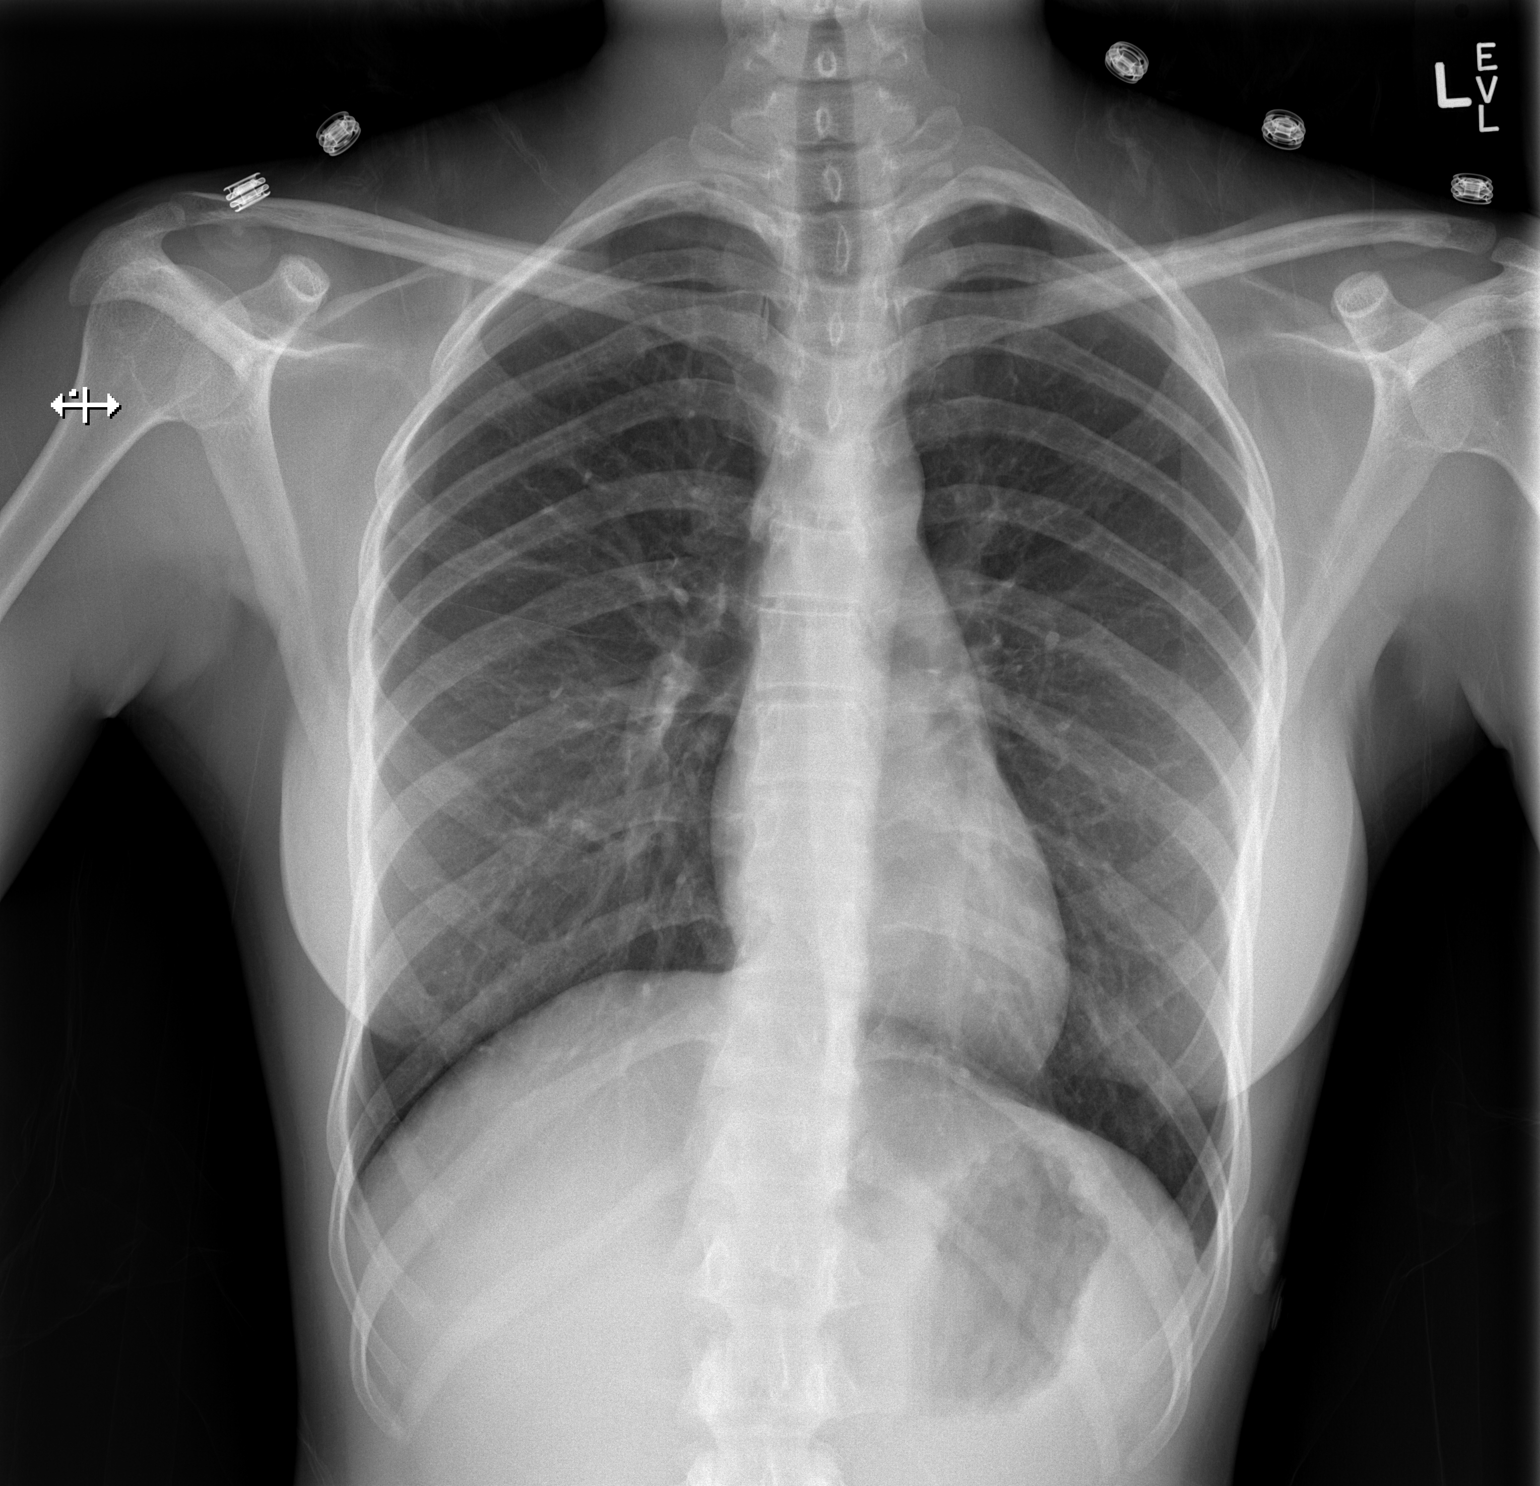

[w chest lat]
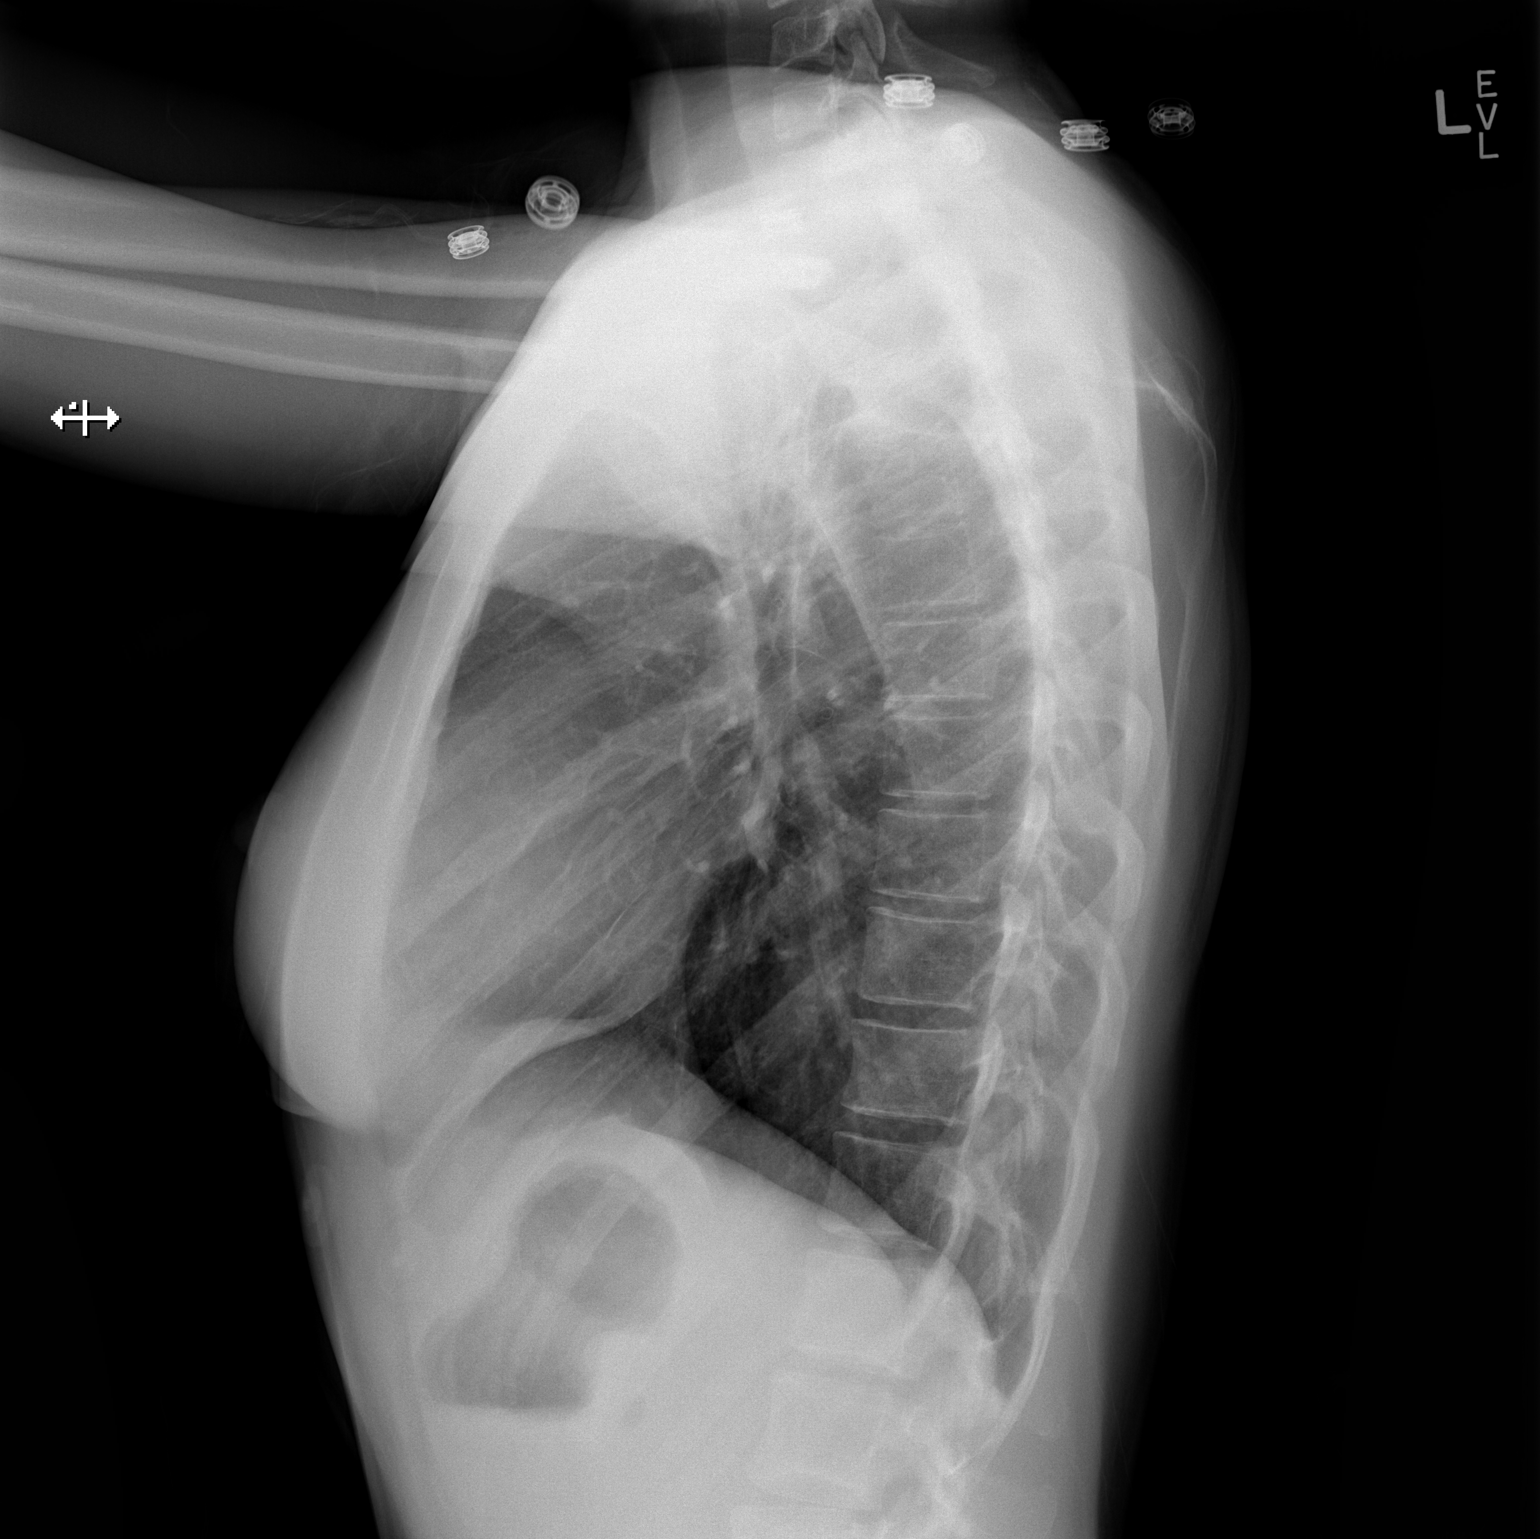

[2 of 2 positions shown; findings below may reference images not displayed]

FINDINGS: The heart size and mediastinal contours are within normal limits.
Both lungs are clear. The visualized skeletal structures are
unremarkable.
IMPRESSION: No active cardiopulmonary disease.

## 2019-01-10 ENCOUNTER — Other Ambulatory Visit: Payer: Self-pay

## 2019-01-10 ENCOUNTER — Emergency Department (HOSPITAL_COMMUNITY)
Admission: EM | Admit: 2019-01-10 | Discharge: 2019-01-10 | Disposition: A | Discharge status: Home / Self Care | Payer: Self-pay | Attending: Emergency Medicine | Admitting: Emergency Medicine

## 2019-01-10 ENCOUNTER — Encounter (HOSPITAL_COMMUNITY): Payer: Self-pay | Admitting: Emergency Medicine

## 2019-01-10 DIAGNOSIS — J029 Acute pharyngitis, unspecified: Secondary | ICD-10-CM | POA: Insufficient documentation

## 2019-01-10 DIAGNOSIS — A599 Trichomoniasis, unspecified: Secondary | ICD-10-CM | POA: Insufficient documentation

## 2019-01-10 DIAGNOSIS — R0982 Postnasal drip: Secondary | ICD-10-CM | POA: Insufficient documentation

## 2019-01-10 DIAGNOSIS — F17210 Nicotine dependence, cigarettes, uncomplicated: Secondary | ICD-10-CM | POA: Insufficient documentation

## 2019-01-10 LAB — URINALYSIS, ROUTINE W REFLEX MICROSCOPIC
BILIRUBIN URINE: NEGATIVE
GLUCOSE, UA: NEGATIVE mg/dL
Ketones, ur: NEGATIVE mg/dL
NITRITE: NEGATIVE
PH: 6 (ref 5.0–8.0)
Protein, ur: 30 mg/dL — AB
SPECIFIC GRAVITY, URINE: 1.011 (ref 1.005–1.030)

## 2019-01-10 LAB — WET PREP, GENITAL
CLUE CELLS WET PREP: NONE SEEN
SPERM: NONE SEEN
Yeast Wet Prep HPF POC: NONE SEEN

## 2019-01-10 LAB — GROUP A STREP BY PCR: GROUP A STREP BY PCR: NOT DETECTED

## 2019-01-10 LAB — PREGNANCY, URINE: Preg Test, Ur: NEGATIVE

## 2019-01-10 MED ORDER — METRONIDAZOLE 500 MG PO TABS
2000.0000 mg | ORAL_TABLET | Freq: Once | ORAL | Status: AC
  Administered 2019-01-10: 2000 mg via ORAL
  Filled 2019-01-10: qty 4

## 2019-01-10 NOTE — Discharge Instructions (Addendum)
You have been diagnosed with Trichomoniasis. This is a sexually transmitted infection. You should not engage in ANY FORM of intercourse with your partner(s) until he/she has bee treated for the infection. You should also refrain from any intercourse until 7 days after your treatment. DO NOT use any sex toys and you will need to discard them. Your partner can be seen and treated for free at the health department but will need to make an appointment.  A list of FREE std testing sights are listed below. Follow up at the [facility] hospital if you have worsening bleeding, discharge, or pain with intecourse. You were tested for Gonorrhea and Chlamydia today, the results of that test will be available in 2 days in you MyChart. You will be contacted if they were abnormal. You were not tested for HIV, Syphillis, HPV or HSV. IF you wish to proceed with further testing, please use the resource list provided. IF you are not actively trying to have a baby, please use birth control. You may get low cost care and birth control through [facility] on. Your strep test was negative. Use tylenol, lozenges and warm salt water gargles for your throat. Free HIV and STD Testing These locations offer FREE confidential testing for HIV, Chlamydia, Gonorrhea, and Syphilis. Non-Traditional Testing Sites Address Telephone  [facility] [address], [place] [phone] Mondays 5pm - 7pm  [facility] [address], [address] [place] [phone] Wednesdays 2pm-8pm  [employer] [facility] [address], [place] [phone] Thursdays 9am-12noon 1pm-4pm  [facility] [address] [place] [phone] Tuesdays Thursdays 9am-12noon 1pm-4pm  [facility] of [employer] offers free, confidential testing and treatment for HIV, Chlamydia,  Gonorrhea, Syphilis, Herpes, Bacterial Vaginosis, Yeast, and Trichomoniasis. Traditional Testing   [facility] [facility] [address] [place] [phone]  Monday thru Friday  Call for an appointment  [facility]- [facility] [address] [place] [phone] Monday thru Friday  Call for anappointment.  If you have any questions about this information please call [phone]. 10/05/2011

## 2019-01-10 NOTE — ED Provider Notes (Signed)
[facility]-EMER[facility] DEPT Provider Note   CSN: [id number] Arrival date & time: 01/10/19  1045     History   Chief Complaint Chief Complaint  Patient presents with  . Sore Throat  . Vaginal Bleeding    HPI Angelica Dennis is a 23 y.o. female presents emergency department chief complaint of vaginal bleeding.  States that she began spotting over the past couple days.  She states that this is very abnormal for her she has never had bleeding in between her menstrual cycles it is not due for another 2 weeks.  Patient has unprotected intercourse with her boyfriend.  She is in a monogamous relationship with a single female partner.  She is not using any kind of birth control but states that she has not actively trying to get pregnant.  Patient denies urinary symptoms, back pain, dyspareunia.  Patient also complains of 3 days of sore throat.  She denies change in voice or difficulty swallowing.  She denies ear pain, dental pain.  She is not taking anything for her symptoms.  She denies fevers or chills  HPI  Past Medical History:  Diagnosis Date  . Anxiety     Patient Active Problem List   Diagnosis Date Noted  . Anxiety 12/21/2017    History reviewed. No pertinent surgical history.   OB History   No obstetric history on file.      Home Medications    Prior to Admission medications   Medication Sig Start Date End Date Taking? Authorizing Provider  hydrOXYzine (ATARAX/VISTARIL) 25 MG tablet Take 1 tablet (25 mg total) by mouth every 6 (six) hours as needed for anxiety. Patient not taking: Reported on 03/01/2018 12/21/17   Charm Rings, NP    Family History No family history on file.  Social History Social History   Tobacco Use  . Smoking status: Current Every Day Smoker    Packs/day: 0.50    Types: Cigarettes  . Smokeless tobacco: Never Used  Substance Use Topics  . Alcohol use: No  . Drug use: No     Allergies   Patient has no known  allergies.   Review of Systems Review of Systems Ten systems reviewed and are negative for acute change, except as noted in the HPI.    Physical Exam Updated Vital Signs BP 136/88 (BP Location: Left Arm)   Pulse 90   Temp 99 F (37.2 C) (Oral)   Resp 19   LMP 12/19/2018   SpO2 100%   Physical Exam Vitals signs and nursing note reviewed.  Constitutional:      General: She is not in acute distress.    Appearance: She is well-developed. She is not diaphoretic.  HENT:     Head: Normocephalic and atraumatic.     Mouth/Throat:     Comments: Mild pharyngeal erythema without exudates, uvula midline, mild tonsillar cervical adenopathy Eyes:     General: No scleral icterus.    Conjunctiva/sclera: Conjunctivae normal.  Neck:     Musculoskeletal: Normal range of motion.  Cardiovascular:     Rate and Rhythm: Normal rate and regular rhythm.     Heart sounds: Normal heart sounds. No murmur. No friction rub. No gallop.   Pulmonary:     Effort: Pulmonary effort is normal. No respiratory distress.     Breath sounds: Normal breath sounds.  Abdominal:     General: Bowel sounds are normal. There is no distension.     Palpations: Abdomen is soft. There is  no mass.     Tenderness: There is no abdominal tenderness. There is no guarding.  Genitourinary:    Comments: Pelvic exam: normal external genitalia, vulva, vagina, cervix with mild mucoid and bloody discharge, uterus and adnexa. Skin:    General: Skin is warm and dry.  Neurological:     Mental Status: She is alert and oriented to person, place, and time.  Psychiatric:        Behavior: Behavior normal.      ED Treatments / Results  Labs (all labs ordered are listed, but only abnormal results are displayed) Labs Reviewed  WET PREP, GENITAL - Abnormal; Notable for the following components:      Result Value   Trich, Wet Prep PRESENT (*)    WBC, Wet Prep HPF POC MANY (*)    All other components within normal limits  URINALYSIS,  ROUTINE W REFLEX MICROSCOPIC - Abnormal; Notable for the following components:   Color, Urine RED (*)    APPearance CLOUDY (*)    Hgb urine dipstick LARGE (*)    Protein, ur 30 (*)    Leukocytes,Ua SMALL (*)    RBC / HPF >50 (*)    Bacteria, UA RARE (*)    Trichomonas, UA PRESENT (*)    All other components within normal limits  GROUP A STREP BY PCR  PREGNANCY, URINE  GC/CHLAMYDIA PROBE AMP ([place]) NOT AT [facility]    EKG None  Radiology No results found.  Procedures Procedures (including critical care time)  Medications Ordered in ED Medications  metroNIDAZOLE (FLAGYL) tablet 2,000 mg (2,000 mg Oral Given 01/10/19 1307)     Initial Impression / Assessment and Plan / ED Course  I have reviewed the triage vital signs and the nursing notes.  Pertinent labs & imaging results that were available during my care of the patient were reviewed by me and considered in my medical decision making (see chart for details).     Patient with bleeding between her menstrual cycle.  Her work-up reveals trichomoniasis infection.  I have discussed the findings with the patient.  She will be treated here.  Detailed discharge instructions given including avoidance of intercourse that is unprotected with her partner who needs treatment. Patient also given outpatient resources.  Patient strep test is negative.  Appears to be acute viral pharyngitis.  Also in the differential is postnasal drip.  She does not have any uvular deviation, no concern for PTA. Appears appropriate for discharge.  Discussed supportive care and return precautions.  Final Clinical Impressions(s) / ED Diagnoses   Final diagnoses:  Trichimoniasis  Pharyngitis, unspecified etiology    ED Discharge Orders    None       Arthor Captain, PA-C 01/10/19 1726    Gwyneth Sprout, MD 01/11/19 1534

## 2019-01-10 NOTE — ED Triage Notes (Signed)
Pt c/o having vaginal spotting for couple days. Reports he last MP was 1/23 and vomited once when bleeding first started. Also having sore throat,

## 2019-01-13 LAB — GC/CHLAMYDIA PROBE AMP (~~LOC~~) NOT AT ARMC
CHLAMYDIA, DNA PROBE: NEGATIVE
NEISSERIA GONORRHEA: NEGATIVE

## 2020-06-02 DIAGNOSIS — Z6791 Unspecified blood type, Rh negative: Secondary | ICD-10-CM | POA: Insufficient documentation

## 2020-07-23 ENCOUNTER — Other Ambulatory Visit: Payer: Self-pay

## 2020-09-06 DIAGNOSIS — D649 Anemia, unspecified: Secondary | ICD-10-CM | POA: Insufficient documentation

## 2020-11-10 ENCOUNTER — Emergency Department (HOSPITAL_COMMUNITY)
Admission: EM | Admit: 2020-11-10 | Discharge: 2020-11-11 | Disposition: A | Discharge status: Home / Self Care | Payer: Medicaid Other | Attending: Emergency Medicine | Admitting: Emergency Medicine

## 2020-11-10 ENCOUNTER — Other Ambulatory Visit: Payer: Self-pay

## 2020-11-10 DIAGNOSIS — N939 Abnormal uterine and vaginal bleeding, unspecified: Secondary | ICD-10-CM | POA: Insufficient documentation

## 2020-11-10 DIAGNOSIS — Z5321 Procedure and treatment not carried out due to patient leaving prior to being seen by health care provider: Secondary | ICD-10-CM | POA: Diagnosis not present

## 2020-11-10 NOTE — ED Triage Notes (Signed)
The pt delivered 9 weeks ago and since then she has had heavy vaginal bleeding this is her first baby and she just thought it was normal

## 2020-11-11 LAB — CBC
HCT: 36 % (ref 36.0–46.0)
Hemoglobin: 11.9 g/dL — ABNORMAL LOW (ref 12.0–15.0)
MCH: 30.2 pg (ref 26.0–34.0)
MCHC: 33.1 g/dL (ref 30.0–36.0)
MCV: 91.4 fL (ref 80.0–100.0)
Platelets: 320 10*3/uL (ref 150–400)
RBC: 3.94 MIL/uL (ref 3.87–5.11)
RDW: 13.4 % (ref 11.5–15.5)
WBC: 6 10*3/uL (ref 4.0–10.5)
nRBC: 0 % (ref 0.0–0.2)

## 2020-11-11 LAB — COMPREHENSIVE METABOLIC PANEL
ALT: 14 U/L (ref 0–44)
AST: 16 U/L (ref 15–41)
Albumin: 3.9 g/dL (ref 3.5–5.0)
Alkaline Phosphatase: 58 U/L (ref 38–126)
Anion gap: 11 (ref 5–15)
BUN: 11 mg/dL (ref 6–20)
CO2: 21 mmol/L — ABNORMAL LOW (ref 22–32)
Calcium: 9.4 mg/dL (ref 8.9–10.3)
Chloride: 104 mmol/L (ref 98–111)
Creatinine, Ser: 0.93 mg/dL (ref 0.44–1.00)
GFR, Estimated: >60 mL/min (ref >60)
Glucose, Bld: 110 mg/dL — ABNORMAL HIGH (ref 70–99)
Potassium: 3.4 mmol/L — ABNORMAL LOW (ref 3.5–5.1)
Sodium: 136 mmol/L (ref 135–145)
Total Bilirubin: 0.6 mg/dL (ref 0.3–1.2)
Total Protein: 7.5 g/dL (ref 6.5–8.1)

## 2020-11-11 NOTE — ED Notes (Signed)
Pt left due to ot being seen quick enough

## 2022-05-15 DIAGNOSIS — O09899 Supervision of other high risk pregnancies, unspecified trimester: Secondary | ICD-10-CM | POA: Insufficient documentation

## 2022-05-15 DIAGNOSIS — O26899 Other specified pregnancy related conditions, unspecified trimester: Secondary | ICD-10-CM | POA: Insufficient documentation

## 2023-05-17 ENCOUNTER — Other Ambulatory Visit: Payer: Self-pay

## 2023-05-17 ENCOUNTER — Emergency Department (HOSPITAL_COMMUNITY): Payer: Medicaid Other

## 2023-05-17 ENCOUNTER — Encounter (HOSPITAL_COMMUNITY): Payer: Self-pay

## 2023-05-17 ENCOUNTER — Emergency Department (HOSPITAL_COMMUNITY)
Admission: EM | Admit: 2023-05-17 | Discharge: 2023-05-17 | Disposition: A | Discharge status: Home / Self Care | Payer: Medicaid Other | Attending: Student | Admitting: Student

## 2023-05-17 DIAGNOSIS — N939 Abnormal uterine and vaginal bleeding, unspecified: Secondary | ICD-10-CM

## 2023-05-17 DIAGNOSIS — R1032 Left lower quadrant pain: Secondary | ICD-10-CM | POA: Diagnosis not present

## 2023-05-17 DIAGNOSIS — O209 Hemorrhage in early pregnancy, unspecified: Secondary | ICD-10-CM | POA: Insufficient documentation

## 2023-05-17 DIAGNOSIS — O3680X0 Pregnancy with inconclusive fetal viability, not applicable or unspecified: Secondary | ICD-10-CM

## 2023-05-17 DIAGNOSIS — Z3A00 Weeks of gestation of pregnancy not specified: Secondary | ICD-10-CM | POA: Diagnosis not present

## 2023-05-17 DIAGNOSIS — Z2914 Encounter for prophylactic rabies immune globin: Secondary | ICD-10-CM | POA: Insufficient documentation

## 2023-05-17 LAB — CBC WITH DIFFERENTIAL/PLATELET
Abs Immature Granulocytes: 0.02 10*3/uL (ref 0.00–0.07)
Basophils Absolute: 0 10*3/uL (ref 0.0–0.1)
Basophils Relative: 1 %
Eosinophils Absolute: 0.2 10*3/uL (ref 0.0–0.5)
Eosinophils Relative: 3 %
HCT: 38.1 % (ref 36.0–46.0)
Hemoglobin: 13.1 g/dL (ref 12.0–15.0)
Immature Granulocytes: 0 %
Lymphocytes Relative: 29 %
Lymphs Abs: 2.2 10*3/uL (ref 0.7–4.0)
MCH: 32.2 pg (ref 26.0–34.0)
MCHC: 34.4 g/dL (ref 30.0–36.0)
MCV: 93.6 fL (ref 80.0–100.0)
Monocytes Absolute: 0.3 10*3/uL (ref 0.1–1.0)
Monocytes Relative: 4 %
Neutro Abs: 4.9 10*3/uL (ref 1.7–7.7)
Neutrophils Relative %: 63 %
Platelets: 331 10*3/uL (ref 150–400)
RBC: 4.07 MIL/uL (ref 3.87–5.11)
RDW: 13.1 % (ref 11.5–15.5)
WBC: 7.7 10*3/uL (ref 4.0–10.5)
nRBC: 0 % (ref 0.0–0.2)

## 2023-05-17 LAB — COMPREHENSIVE METABOLIC PANEL
ALT: 15 U/L (ref 0–44)
AST: 18 U/L (ref 15–41)
Albumin: 4.2 g/dL (ref 3.5–5.0)
Alkaline Phosphatase: 66 U/L (ref 38–126)
Anion gap: 8 (ref 5–15)
BUN: 8 mg/dL (ref 6–20)
CO2: 21 mmol/L — ABNORMAL LOW (ref 22–32)
Calcium: 9 mg/dL (ref 8.9–10.3)
Chloride: 108 mmol/L (ref 98–111)
Creatinine, Ser: 0.84 mg/dL (ref 0.44–1.00)
GFR, Estimated: >60 mL/min (ref >60)
Glucose, Bld: 101 mg/dL — ABNORMAL HIGH (ref 70–99)
Potassium: 3.5 mmol/L (ref 3.5–5.1)
Sodium: 137 mmol/L (ref 135–145)
Total Bilirubin: 0.4 mg/dL (ref 0.3–1.2)
Total Protein: 8.1 g/dL (ref 6.5–8.1)

## 2023-05-17 LAB — URINALYSIS, ROUTINE W REFLEX MICROSCOPIC
Bilirubin Urine: NEGATIVE
Glucose, UA: NEGATIVE mg/dL
Ketones, ur: NEGATIVE mg/dL
Nitrite: NEGATIVE
Protein, ur: 100 mg/dL — AB
RBC / HPF: >50 RBC/hpf (ref 0–5)
Specific Gravity, Urine: 1.026 (ref 1.005–1.030)
pH: 5 (ref 5.0–8.0)

## 2023-05-17 LAB — HCG, QUANTITATIVE, PREGNANCY: hCG, Beta Chain, Quant, S: 84 m[IU]/mL — ABNORMAL HIGH (ref <5)

## 2023-05-17 LAB — ABO/RH: ABO/RH(D): O NEG

## 2023-05-17 LAB — PREGNANCY, URINE: Preg Test, Ur: POSITIVE — AB

## 2023-05-17 MED ORDER — SODIUM CHLORIDE 0.9 % IV BOLUS: 1000.0000 mL | Freq: Once | INTRAVENOUS | Status: DC

## 2023-05-17 MED ORDER — PYRIDOXINE HCL 100 MG/ML IJ SOLN
100.0000 mg | Freq: Once | INTRAMUSCULAR | Status: AC
  Administered 2023-05-17: 100 mg via INTRAVENOUS
  Filled 2023-05-17: qty 1

## 2023-05-17 MED ORDER — DOXYLAMINE SUCCINATE (SLEEP) 25 MG PO TABS: 25.0000 mg | ORAL_TABLET | Freq: Two times a day (BID) | ORAL | 0 refills | Status: DC | PRN

## 2023-05-17 MED ORDER — PROCHLORPERAZINE EDISYLATE 10 MG/2ML IJ SOLN
10.0000 mg | Freq: Once | INTRAMUSCULAR | Status: AC
  Administered 2023-05-17: 10 mg via INTRAVENOUS
  Filled 2023-05-17: qty 2

## 2023-05-17 MED ORDER — RHO D IMMUNE GLOBULIN 1500 UNIT/2ML IJ SOSY
300.0000 ug | PREFILLED_SYRINGE | Freq: Once | INTRAMUSCULAR | Status: AC
  Administered 2023-05-17: 300 ug via INTRAMUSCULAR
  Filled 2023-05-17: qty 2

## 2023-05-17 MED ORDER — DIPHENHYDRAMINE HCL 50 MG/ML IJ SOLN
25.0000 mg | Freq: Once | INTRAMUSCULAR | Status: AC
  Administered 2023-05-17: 25 mg via INTRAVENOUS
  Filled 2023-05-17: qty 1

## 2023-05-17 MED ORDER — PYRIDOXINE HCL 10 MG PO TABS: 1.0000 | ORAL_TABLET | Freq: Two times a day (BID) | ORAL | 0 refills | Status: DC | PRN

## 2023-05-17 NOTE — Discharge Instructions (Addendum)
Your ultrasound did not show signs of a pregnancy, given that your hCG levels were quite low it is likely too early to be able to see a gestational sac on your ultrasound today.  Because of this we do not know if the pregnancy is in the correct location or is ectopic.  You will need to follow-up in 2 days to have repeat hCG levels drawn and will need to follow-up with your OB/GYN within 1 week for repeat ultrasound and further follow-up.  If you start having increased vaginal bleeding, new or worsening abdominal pain, feel lightheaded or pass out you should immediately go to the MAU at [facility] for further evaluation.

## 2023-05-17 NOTE — ED Provider Notes (Signed)
[place] EMERGENCY DEPARTMENT AT [facility] Provider Note   CSN: [id number] Arrival date & time: 05/17/23  7829     History  Chief Complaint  Patient presents with   Vaginal Bleeding    Angelica Dennis is a 27 y.o. female.  HPI   Patient not significant medical history presenting with complaints of vaginal bleeding.  Patient states that started 2 hours ago, states as she was putting her child to bed  went to use the restroom noticed that she was passing large blood clots, she started to have intermittent left lower quadrant cramping which goes into her left flank, she denies any nausea vomiting, still passing gas having normal bowel movements denies any urinary symptoms or vaginal discharge.  Patient states that her last menstrual cycle was on the 23rd of last month she states she is regular, she is currently not on any birth control, she has history of ovarian cysts and states it feels slightly similar, no history of ovarian torsion's or ectopic pregnancies, had an uncomplicated vaginal delivery in December.  She is not actively breast-feeding at this time.  She has no abdominal surgical history, she has no other complaints.  Home Medications Prior to Admission medications   Medication Sig Start Date End Date Taking? Authorizing Provider  hydrOXYzine (ATARAX/VISTARIL) 25 MG tablet Take 1 tablet (25 mg total) by mouth every 6 (six) hours as needed for anxiety. Patient not taking: Reported on 03/01/2018 12/21/17   Charm Rings, NP      Allergies    Patient has no known allergies.    Review of Systems   Review of Systems  Constitutional:  Negative for chills and fever.  Respiratory:  Negative for shortness of breath.   Cardiovascular:  Negative for chest pain.  Gastrointestinal:  Positive for abdominal pain. Negative for diarrhea, nausea and vomiting.  Genitourinary:  Positive for flank pain and vaginal pain.  Neurological:  Negative for headaches.    Physical  Exam Updated Vital Signs BP (!) 155/106   Pulse (!) 121   Temp 98.6 F (37 C) (Oral)   Resp 18   Ht 5\' 10"  (1.778 m)   Wt 77.1 kg   LMP 04/19/2023 (Exact Date)   SpO2 98%   BMI 24.39 kg/m  Physical Exam Vitals and nursing note reviewed.  Constitutional:      General: She is not in acute distress.    Appearance: She is not ill-appearing.  HENT:     Head: Normocephalic and atraumatic.     Nose: No congestion.  Eyes:     Conjunctiva/sclera: Conjunctivae normal.  Cardiovascular:     Rate and Rhythm: Normal rate and regular rhythm.     Pulses: Normal pulses.     Heart sounds: No murmur heard.    No friction rub. No gallop.  Pulmonary:     Effort: No respiratory distress.     Breath sounds: No wheezing, rhonchi or rales.  Abdominal:     Palpations: Abdomen is soft.     Tenderness: There is abdominal tenderness. There is no right CVA tenderness or left CVA tenderness.     Comments: Abdomen nondistended, soft, tenderness on the left lower quadrant/left flank, without guarding rebound tenderness or peritoneal sign, negative CVA tenderness.  Skin:    General: Skin is warm and dry.  Neurological:     Mental Status: She is alert.  Psychiatric:        Mood and Affect: Mood normal.     ED  Results / Procedures / Treatments   Labs (all labs ordered are listed, but only abnormal results are displayed) Labs Reviewed  URINALYSIS, ROUTINE W REFLEX MICROSCOPIC - Abnormal; Notable for the following components:      Result Value   APPearance HAZY (*)    Hgb urine dipstick LARGE (*)    Protein, ur 100 (*)    Leukocytes,Ua TRACE (*)    Bacteria, UA FEW (*)    All other components within normal limits  CBC WITH DIFFERENTIAL/PLATELET  PREGNANCY, URINE  COMPREHENSIVE METABOLIC PANEL  ABO/RH    EKG None  Radiology No results found.  Procedures Procedures    Medications Ordered in ED Medications - No data to display  ED Course/ Medical Decision Making/ A&P                              Medical Decision Making Amount and/or Complexity of Data Reviewed Labs: ordered.   This patient presents to the ED for concern of abdominal pain, this involves an extensive number of treatment options, and is a complaint that carries with it a high risk of complications and morbidity.  The differential diagnosis includes ectopic pregnancy, ovarian torsion, hemorrhagic cyst, dysmenorrhea, diverticulitis, UTI, kidney stone    Additional history obtained:  Additional history obtained from partner at bedside External records from outside source obtained and reviewed including recent hospitalization   Co morbidities that complicate the patient evaluation  N/A  Social Determinants of Health:  No care provider    Lab Tests:  I Ordered, and personally interpreted labs.  The pertinent results include:  pending   Imaging Studies ordered:  I ordered imaging studies including n/a  I independently visualized and interpreted imaging which showed n/a I agree with the radiologist interpretation   Cardiac Monitoring:  The patient was maintained on a cardiac monitor.  I personally viewed and interpreted the cardiac monitored which showed an underlying rhythm of: N/A   Medicines ordered and prescription drug management:  I ordered medication including N/A I have reviewed the patients home medicines and have made adjustments as needed  Critical Interventions:  N/A   Reevaluation:  Presents with left lower quadrant tenderness and vaginal bleeding, she had benign physical exam, will obtain screening lab workup for further evaluation.   Consultations Obtained:  N/a    Test Considered:  N/a    Rule out N/a    Dispostion and problem list  Due to shift change patient be handed off to forward PA-C  My suspicion is likely patient is suffering from hemorrhagic/menorrhea recommend following up on lab workup and likely ultrasound.   If all unremarkable  suspect patient be discharged home and follow-up with her OB/GYN and.            Final Clinical Impression(s) / ED Diagnoses Final diagnoses:  Vaginal bleeding    Rx / DC Orders ED Discharge Orders     None         Carroll Sage, PA-C 05/17/23 1610    Glendora Score, MD 05/17/23 [phone]

## 2023-05-17 NOTE — ED Triage Notes (Signed)
Sts sudden LLQ cramping that radiates to back best described as labor pains. Went to restroom right after and noticed large blood clots.   Recent vaginal delivery in December.   Hx of iron deficiency anemia.

## 2023-05-17 NOTE — ED Notes (Signed)
Blood bank called confirmed pt is O negative and pregnancy positive  Do we need to do rhogam?  Call back at [phone] (blood bank)

## 2023-05-17 NOTE — ED Provider Notes (Signed)
Care assumed from PA Will Woodway, please see his note for full details, but in brief 27 year old female presents with vaginal bleeding outside her typical menstrual cycle with some associated nausea and vomiting.  Unsure if she is pregnant.  Initial lab evaluation shows stable hemoglobin, UA with some contamination but no convincing signs for infection.  Pregnancy test and CMP pending and then plan for ultrasound.  Physical Exam  BP (!) 155/106   Pulse (!) 121   Temp 98.6 F (37 C) (Oral)   Resp 18   Ht 5\' 10"  (1.778 m)   Wt 77.1 kg   LMP 04/19/2023 (Exact Date)   SpO2 98%   BMI 24.39 kg/m   Physical Exam Vitals and nursing note reviewed.  Constitutional:      General: She is not in acute distress.    Appearance: Normal appearance. She is well-developed. She is not ill-appearing or diaphoretic.     Comments: Pt is actively vomiting on my evaluation, but not ill-appearing  HENT:     Head: Normocephalic and atraumatic.  Eyes:     General:        Right eye: No discharge.        Left eye: No discharge.  Pulmonary:     Effort: Pulmonary effort is normal. No respiratory distress.  Genitourinary:    Comments: Performed by prior provider Musculoskeletal:     Cervical back: Neck supple.  Neurological:     Mental Status: She is alert and oriented to person, place, and time.     Coordination: Coordination normal.  Psychiatric:        Mood and Affect: Mood normal.        Behavior: Behavior normal.     Procedures  Procedures  ED Course / MDM   Labs Reviewed  PREGNANCY, URINE - Abnormal; Notable for the following components:      Result Value   Preg Test, Ur POSITIVE (*)    All other components within normal limits  URINALYSIS, ROUTINE W REFLEX MICROSCOPIC - Abnormal; Notable for the following components:   APPearance HAZY (*)    Hgb urine dipstick LARGE (*)    Protein, ur 100 (*)    Leukocytes,Ua TRACE (*)    Bacteria, UA FEW (*)    All other components within normal  limits  COMPREHENSIVE METABOLIC PANEL - Abnormal; Notable for the following components:   CO2 21 (*)    Glucose, Bld 101 (*)    All other components within normal limits  HCG, QUANTITATIVE, PREGNANCY - Abnormal; Notable for the following components:   hCG, Beta Chain, Quant, S 84 (*)    All other components within normal limits  CBC WITH DIFFERENTIAL/PLATELET  ABO/RH   No results found.   Medical Decision Making Amount and/or Complexity of Data Reviewed Labs: ordered. Decision-making details documented in ED Course. Radiology: ordered.  Risk Prescription drug management.    Patient's pregnancy test has returned positive, will send quantitative hCG and obtain ultrasound to rule out ectopic pregnancy.  Patient is actively vomiting, IV antiemetics, fluids and pyridoxine given.  Patient is Rh-, given positive pregnancy and vaginal bleeding will give RhoGAM.  Ultrasound returned, independently viewed and interpreted, no visible gestational sac, at this time pregnancy of unknown location, suspect it is likely too early given quantitative hCG of 84.  Discussed results with patient at bedside.  Nausea and vomiting improved with medication.  Patient has been instructed to follow-up in 48 hours for repeat hCG level at the [facility]  or with her obstetrician's office.  Strict return precautions provided.  Patient discharged home in good condition.      Dartha Lodge, PA-C 05/17/23 1610    Lorre Nick, MD 05/17/23 [phone]

## 2023-05-18 LAB — RH IG WORKUP (INCLUDES ABO/RH)
ABO/RH(D): O NEG
Antibody Screen: NEGATIVE
Gestational Age(Wks): 3
Unit division: 0

## 2023-06-28 ENCOUNTER — Ambulatory Visit: Payer: Medicaid Other | Admitting: Obstetrics and Gynecology

## 2023-06-28 ENCOUNTER — Encounter: Payer: Self-pay | Admitting: Obstetrics and Gynecology

## 2023-06-28 VITALS — BP 120/74 | HR 72 | Ht 70.0 in | Wt 165.0 lb

## 2023-06-28 DIAGNOSIS — Z8742 Personal history of other diseases of the female genital tract: Secondary | ICD-10-CM

## 2023-06-28 DIAGNOSIS — Z3201 Encounter for pregnancy test, result positive: Secondary | ICD-10-CM | POA: Diagnosis not present

## 2023-06-28 NOTE — Patient Instructions (Signed)
Human Chorionic Gonadotropin Test Why am I having this test? A human chorionic gonadotropin (hCG) test is done to determine whether you are pregnant. It can also be used: To diagnose an abnormal pregnancy. To determine whether you have had a miscarriage or are at risk of one. What is being tested? This test checks the level of the human chorionic gonadotropin (hCG) hormone in the blood. This hormone is produced during pregnancy by the cells that form the placenta. The placenta is the organ that grows inside your uterus to nourish a developing baby. When you are pregnant, hCG can be detected in your blood or urine 7 to 8 days before your missed period. The amount of hCG continues to increase for the first 8-10 weeks of pregnancy. The presence of hCG in your blood can be measured with different types of tests. You may have: A urine test. A urine test only shows whether there is hCG in your urine. It does not measure how much. A qualitative blood test. This blood test only shows whether there is hCG in your blood. It does not measure how much. A quantitative blood test. This type of blood test measures the amount of hCG in your blood. You may have this test to: Diagnose an abnormal pregnancy. Check whether you have had a miscarriage. Determine whether you are at risk of a miscarriage. Determine if treatment of an ectopic pregnancy is successful. What kind of sample is taken?     Two kinds of samples may be collected to test for the hCG hormone. Blood. It is usually collected by inserting a needle into a blood vessel. Urine. It is usually collected by urinating into a germ-free (sterile) specimen cup. How do I prepare for this test? No preparation is needed for a blood test.  Some preparation is needed for a urine test: For best results, collect the sample the first time you urinate in the morning. That is when the concentration of hCG is highest. Do not drink too much fluid. Drink as you  normally would, or as directed by your health care provider. Tell a health care provider about: All medicines you are taking, including vitamins, herbs, eye drops, creams, and over-the-counter medicines. Any blood in your urine. This may interfere with the result. How are the results reported? Depending on the type of test that you have, your test results may be reported as values. Your health care provider will compare your results to normal ranges that were established after testing a large group of people (reference ranges). Reference ranges may vary among labs and hospitals. For this test, common reference ranges that show absence of pregnancy are: Quantitative hCG blood levels: less than 5 IU/L. Other results will be reported as either positive or negative. For this test, normal results (meaning the absence of pregnancy) are: Negative for hCG in the urine test. Negative for hCG in the qualitative blood test. What do the results mean? Urine and qualitative blood test A negative result could mean: That you are not pregnant. That the test was done too early in your pregnancy to detect hCG in your blood or urine. If you still have other signs of pregnancy, the test will be repeated. A positive result means: That you are most likely pregnant. Your health care provider may confirm your pregnancy with an ultrasound of your uterus, if needed. Quantitative blood test Results of the quantitative hCG blood test will be reported as values. These values will be interpreted by your health care  provider along with your medical history and symptoms you are experiencing. Results outside of expected ranges could mean that: You are pregnant with twins. You have abnormal growths in your uterus. You have an ectopic pregnancy. You may be experiencing a miscarriage. Talk with your health care provider about what your results mean. Questions to ask your health care provider Ask your health care provider, or  the department that is doing the test: When will my results be ready? How will I get my results? What are my treatment options? What other tests do I need? What are my next steps? Summary A human chorionic gonadotropin (hCG) test is done to determine whether you are pregnant. When you are pregnant, hCG can be detected in your blood or urine 7 to 8 days before your missed period. HCG levels continue to go up for the first 8-10 weeks of pregnancy. Your hCG level can be measured with different types of tests. You may have a urine test, a qualitative blood test, or a quantitative blood test. Talk with your health care provider about what your test results mean. This information is not intended to replace advice given to you by your health care provider. Make sure you discuss any questions you have with your health care provider. Document Revised: 08/16/2020 Document Reviewed: 08/16/2020 Elsevier Patient Education  2024 [employer].

## 2023-06-28 NOTE — Progress Notes (Signed)
  GYNECOLOGY PROGRESS NOTE  History:  [name] is a 27 y.o. G3P2010 presents to [facility] office today for problem gyn visit. She reports she had a EAB with Cytotec on 06/06/2023. She reports she bled for about 1 week afterwards and has had irregular VB since. She now wants a Rx for [facility] pills to prevent any further unplanned pregnancies. She denies h/a, dizziness, shortness of breath, n/v, or fever/chills.    The following portions of the patient's history were reviewed and updated as appropriate: allergies, current medications, past family history, past medical history, past social history, past surgical history and problem list. Last pap smear on 2023 was normal.  Review of Systems:  Pertinent items are noted in HPI.   Objective:  Physical Exam Blood pressure 120/74, pulse 72, height 5\' 10"  (1.778 m), weight 165 lb (74.8 kg). VS reviewed, nursing note reviewed,  Constitutional: well developed, well nourished, no distress HEENT: normocephalic CV: normal rate Pulm/chest wall: normal effort Breast Exam: deferred Abdomen: soft Neuro: alert and oriented x 3 Skin: warm, dry Psych: affect normal Pelvic exam: deferred  Assessment & Plan:  1. History of termination of pregnancy - Beta hCG quant (ref lab) - Will notify patient of plan of care dependent on results of HCG level  2. Positive urine pregnancy test   Total face-to-face time spent during this encounter was 15 minutes. There was 5 minutes of chart review time spent prior to this encounter. Total time spent = 20 minutes.    Raelyn Mora, CNM 3:44 PM

## 2023-06-28 NOTE — Progress Notes (Signed)
NGYN presents to establish care.  Last PAP 2023 in [place], no Hx of abnormal PAP. Pt had IAB in June and has had irregular bleeding.  Requesting BC pills Declines STD testing

## 2023-07-02 ENCOUNTER — Encounter: Payer: Self-pay | Admitting: Obstetrics and Gynecology

## 2023-07-03 ENCOUNTER — Other Ambulatory Visit: Payer: Medicaid Other

## 2023-07-03 DIAGNOSIS — Z8742 Personal history of other diseases of the female genital tract: Secondary | ICD-10-CM

## 2023-07-03 LAB — BETA HCG QUANT (REF LAB): hCG Quant: 674 m[IU]/mL

## 2023-07-11 ENCOUNTER — Other Ambulatory Visit: Payer: Medicaid Other

## 2023-07-11 DIAGNOSIS — Z8742 Personal history of other diseases of the female genital tract: Secondary | ICD-10-CM

## 2023-07-12 LAB — BETA HCG QUANT (REF LAB): hCG Quant: 203 m[IU]/mL

## 2023-07-17 ENCOUNTER — Other Ambulatory Visit: Payer: Self-pay

## 2023-07-17 DIAGNOSIS — Z8742 Personal history of other diseases of the female genital tract: Secondary | ICD-10-CM

## 2023-07-18 ENCOUNTER — Telehealth: Payer: Self-pay

## 2023-07-18 LAB — BETA HCG QUANT (REF LAB): hCG Quant: 12 m[IU]/mL

## 2023-07-18 NOTE — Telephone Encounter (Signed)
-----   Message from Raelyn Mora sent at 07/18/2023 12:29 PM EDT ----- Schedule for HCG next week and then send me a message, so I can start her on [facility] pills

## 2023-07-18 NOTE — Telephone Encounter (Signed)
Pt informed of results and schedule for HCG in one week.

## 2023-07-24 ENCOUNTER — Other Ambulatory Visit: Payer: Medicaid Other

## 2023-08-04 ENCOUNTER — Other Ambulatory Visit (INDEPENDENT_AMBULATORY_CARE_PROVIDER_SITE_OTHER): Payer: Self-pay | Admitting: Obstetrics and Gynecology

## 2023-08-04 DIAGNOSIS — Z30011 Encounter for initial prescription of contraceptive pills: Secondary | ICD-10-CM

## 2023-08-04 MED ORDER — NORETHIN ACE-ETH ESTRAD-FE 1-20 MG-MCG PO TABS
1.0000 | ORAL_TABLET | Freq: Every day | ORAL | 11 refills | Status: DC
Start: 2023-08-04 — End: 2024-05-19

## 2023-10-09 ENCOUNTER — Other Ambulatory Visit: Payer: Self-pay

## 2023-10-09 ENCOUNTER — Ambulatory Visit
Admission: EM | Admit: 2023-10-09 | Discharge: 2023-10-09 | Disposition: A | Discharge status: Home / Self Care | Payer: Medicaid Other | Attending: Internal Medicine | Admitting: Internal Medicine

## 2023-10-09 ENCOUNTER — Encounter: Payer: Self-pay | Admitting: Emergency Medicine

## 2023-10-09 DIAGNOSIS — Z1152 Encounter for screening for COVID-19: Secondary | ICD-10-CM | POA: Diagnosis not present

## 2023-10-09 DIAGNOSIS — J069 Acute upper respiratory infection, unspecified: Secondary | ICD-10-CM | POA: Diagnosis present

## 2023-10-09 DIAGNOSIS — J029 Acute pharyngitis, unspecified: Secondary | ICD-10-CM | POA: Insufficient documentation

## 2023-10-09 LAB — POCT RAPID STREP A (OFFICE): Rapid Strep A Screen: NEGATIVE

## 2023-10-09 LAB — POC COVID19/FLU A&B COMBO
Covid Antigen, POC: NEGATIVE
Influenza A Antigen, POC: NEGATIVE
Influenza B Antigen, POC: NEGATIVE

## 2023-10-09 MED ORDER — FLUTICASONE PROPIONATE 50 MCG/ACT NA SUSP: 1.0000 | Freq: Every day | NASAL | 0 refills | Status: DC

## 2023-10-09 MED ORDER — PROMETHAZINE-DM 6.25-15 MG/5ML PO SYRP: 5.0000 mL | ORAL_SOLUTION | Freq: Four times a day (QID) | ORAL | 0 refills | Status: DC | PRN

## 2023-10-09 NOTE — ED Provider Notes (Signed)
[facility]    CSN: [id number] Arrival date & time: 10/09/23  1054      History   Chief Complaint Chief Complaint  Patient presents with   Generalized Body Aches   Cough    HPI Angelica Dennis is a 27 y.o. female.   Patient presents with approximately 3-day history of nasal congestion, cough, generalized bodyaches, sore throat.  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  Denies any documented fever.  She has taken Tylenol for symptoms.  Denies history of asthma.  Patient not reporting any chest pain or shortness of breath.   Cough   Past Medical History:  Diagnosis Date   Anxiety     Patient Active Problem List   Diagnosis Date Noted   Anxiety 12/21/2017    History reviewed. No pertinent surgical history.  OB History     Gravida  3   Para  2   Term  2   Preterm      AB  1   Living         SAB      IAB  1   Ectopic      Multiple      Live Births               Home Medications    Prior to Admission medications   Medication Sig Start Date End Date Taking? Authorizing Provider  fluticasone (FLONASE) 50 MCG/ACT nasal spray Place 1 spray into both nostrils daily. 10/09/23  Yes Tephanie Escorcia, Acie Fredrickson, FNP  promethazine-dextromethorphan (PROMETHAZINE-DM) 6.25-15 MG/5ML syrup Take 5 mLs by mouth every 6 (six) hours as needed for cough. 10/09/23  Yes Kyria Bumgardner, Acie Fredrickson, FNP  doxylamine, Sleep, (UNISOM) 25 MG tablet Take 1 tablet (25 mg total) by mouth 2 (two) times daily as needed (Nausea). Patient not taking: Reported on 06/28/2023 05/17/23   Dartha Lodge, PA-C  norethindrone-ethinyl estradiol-FE (JUNEL FE 1/20) 1-20 MG-MCG tablet Take 1 tablet by mouth daily. 08/04/23   Raelyn Mora, CNM  Pyridoxine HCl 10 MG TABS Take 1 tablet by mouth 2 (two) times daily as needed (nausea). Patient not taking: Reported on 06/28/2023 05/17/23   Legrand Rams    Family History History  reviewed. No pertinent family history.  Social History Social History   Tobacco Use   Smoking status: Former    Current packs/day: 0.50    Types: Cigarettes   Smokeless tobacco: Never  Vaping Use   Vaping status: Never Used  Substance Use Topics   Alcohol use: No   Drug use: No     Allergies   Patient has no known allergies.   Review of Systems Review of Systems Per HPI  Physical Exam Triage Vital Signs ED Triage Vitals  Encounter Vitals Group     BP 10/09/23 1218 123/71     Systolic BP Percentile --      Diastolic BP Percentile --      Pulse Rate 10/09/23 1218 77     Resp 10/09/23 1218 18     Temp 10/09/23 1218 98.2 F (36.8 C)     Temp Source 10/09/23 1218 Oral     SpO2 10/09/23 1218 98 %     Weight --      Height --      Head Circumference --      Peak Flow --      Pain Score 10/09/23 1219 5  Pain Loc --      Pain Education --      Exclude from Growth Chart --    No data found.  Updated Vital Signs BP 123/71 (BP Location: Left Arm)   Pulse 77   Temp 98.2 F (36.8 C) (Oral)   Resp 18   SpO2 98%   Visual Acuity Right Eye Distance:   Left Eye Distance:   Bilateral Distance:    Right Eye Near:   Left Eye Near:    Bilateral Near:     Physical Exam Constitutional:      General: She is not in acute distress.    Appearance: Normal appearance. She is not toxic-appearing or diaphoretic.  HENT:     Head: Normocephalic and atraumatic.     Right Ear: Tympanic membrane and ear canal normal.     Left Ear: Tympanic membrane and ear canal normal.     Nose: Congestion present.     Mouth/Throat:     Mouth: Mucous membranes are moist.     Pharynx: Posterior oropharyngeal erythema present.  Eyes:     Extraocular Movements: Extraocular movements intact.     Conjunctiva/sclera: Conjunctivae normal.     Pupils: Pupils are equal, round, and reactive to light.  Cardiovascular:     Rate and Rhythm: Normal rate and regular rhythm.     Pulses: Normal  pulses.     Heart sounds: Normal heart sounds.  Pulmonary:     Effort: Pulmonary effort is normal. No respiratory distress.     Breath sounds: Normal breath sounds. No stridor. No wheezing, rhonchi or rales.  Abdominal:     General: Abdomen is flat. Bowel sounds are normal.     Palpations: Abdomen is soft.  Musculoskeletal:        General: Normal range of motion.     Cervical back: Normal range of motion.  Skin:    General: Skin is warm and dry.  Neurological:     General: No focal deficit present.     Mental Status: She is alert and oriented to person, place, and time. Mental status is at baseline.  Psychiatric:        Mood and Affect: Mood normal.        Behavior: Behavior normal.      UC Treatments / Results  Labs (all labs ordered are listed, but only abnormal results are displayed) Labs Reviewed  CULTURE, GROUP A STREP ([facility])  SARS CORONAVIRUS 2 (TAT 6-24 HRS)  POC COVID19/FLU A&B COMBO  POCT RAPID STREP A (OFFICE)    EKG   Radiology No results found.  Procedures Procedures (including critical care time)  Medications Ordered in UC Medications - No data to display  Initial Impression / Assessment and Plan / UC Course  I have reviewed the triage vital signs and the nursing notes.  Pertinent labs & imaging results that were available during my care of the patient were reviewed by me and considered in my medical decision making (see chart for details).     Patient presents with symptoms likely from a viral upper respiratory infection.  Do not suspect underlying cardiopulmonary process. Symptoms seem unlikely related to ACS, CHF or COPD exacerbations, pneumonia, pneumothorax. Patient is nontoxic appearing and not in need of emergent medical intervention.  Rapid COVID, rapid flu, rapid strep negative.  Throat culture and COVID PCR pending.  Recommended symptom control with medications, supportive care, fluids, rest.  Will prescribe Flonase and Promethazine DM for  symptoms.  Advised  patient Promethazine DM can cause drowsiness.  Patient denies that she takes any daily medications so that should be safe.  Return if symptoms fail to improve in 1-2 weeks or you develop shortness of breath, chest pain, severe headache. Patient states understanding and is agreeable.  Discharged with PCP followup.  Final Clinical Impressions(s) / UC Diagnoses   Final diagnoses:  Viral upper respiratory tract infection with cough  Sore throat     Discharge Instructions      COVID, flu, strep negative.  Suspect viral illness as we discussed.  I have prescribed you 2 medications to help with symptoms.  Please be advised that cough medication can make you drowsy.  Follow-up if any symptoms persist or worsen.     ED Prescriptions     Medication Sig Dispense Auth. Provider   promethazine-dextromethorphan (PROMETHAZINE-DM) 6.25-15 MG/5ML syrup Take 5 mLs by mouth every 6 (six) hours as needed for cough. 118 mL Kenyon Eshleman, Rolly Salter E, FNP   fluticasone [facility]) 50 MCG/ACT nasal spray Place 1 spray into both nostrils daily. 16 g Gustavus Bryant, [place]      PDMP not reviewed this encounter.   Gustavus Bryant, [place] 10/09/23 1343

## 2023-10-09 NOTE — Discharge Instructions (Signed)
COVID, flu, strep negative.  Suspect viral illness as we discussed.  I have prescribed you 2 medications to help with symptoms.  Please be advised that cough medication can make you drowsy.  Follow-up if any symptoms persist or worsen.

## 2023-10-09 NOTE — ED Triage Notes (Signed)
Pt sts cough, congestion and body aches x 3 days

## 2023-10-10 LAB — SARS CORONAVIRUS 2 (TAT 6-24 HRS): SARS Coronavirus 2: NEGATIVE

## 2023-10-12 LAB — CULTURE, GROUP A STREP (THRC)

## 2024-01-11 ENCOUNTER — Ambulatory Visit
Admission: RE | Admit: 2024-01-11 | Discharge: 2024-01-11 | Disposition: A | Discharge status: Home / Self Care | Payer: Self-pay | Source: Ambulatory Visit

## 2024-01-11 ENCOUNTER — Other Ambulatory Visit: Payer: Self-pay

## 2024-01-11 ENCOUNTER — Ambulatory Visit (INDEPENDENT_AMBULATORY_CARE_PROVIDER_SITE_OTHER): Payer: Medicaid Other

## 2024-01-11 VITALS — BP 105/71 | HR 91 | Temp 98.3°F | Resp 18

## 2024-01-11 DIAGNOSIS — R053 Chronic cough: Secondary | ICD-10-CM

## 2024-01-11 DIAGNOSIS — J329 Chronic sinusitis, unspecified: Secondary | ICD-10-CM | POA: Diagnosis not present

## 2024-01-11 LAB — POCT URINE PREGNANCY: Preg Test, Ur: NEGATIVE

## 2024-01-11 LAB — POC COVID19/FLU A&B COMBO
Covid Antigen, POC: NEGATIVE
Influenza A Antigen, POC: NEGATIVE
Influenza B Antigen, POC: NEGATIVE

## 2024-01-11 MED ORDER — AZITHROMYCIN 250 MG PO TABS: 250.0000 mg | ORAL_TABLET | Freq: Every day | ORAL | 0 refills | Status: DC

## 2024-01-11 MED ORDER — AMOXICILLIN-POT CLAVULANATE 875-125 MG PO TABS: 1.0000 | ORAL_TABLET | Freq: Two times a day (BID) | ORAL | 0 refills | Status: DC

## 2024-01-11 NOTE — ED Triage Notes (Addendum)
Chills dry cough sore throat shortness of breath and massive headache - Entered by patient  Pt reports 2 weeks of intermittent cough, sore throat, headache. States she will feel better then feel worse again. States "I get really bad headaches, my head is spinning". Denies known fever but "was sweating really bad yesterday". She took dayquil at 0500. Also using throat lozenges for the sore throat. States her work sent her home yesterday

## 2024-01-11 NOTE — ED Provider Notes (Signed)
 [facility]    CSN: [id number] Arrival date & time: 01/11/24  1244      History   Chief Complaint Chief Complaint  Patient presents with   Cough    HPI Angelica Dennis is a 28 y.o. female.   Patient here today for evaluation of cough that started two weeks ago. She reports headache and sore throat as well. She has not had fever but does report sweating yesterday. She has taken OTC meds without resolution.   The history is provided by the patient.  Cough Associated symptoms: headaches and sore throat   Associated symptoms: no chills, no ear pain, no eye discharge, no fever, no shortness of breath and no wheezing     Past Medical History:  Diagnosis Date   Anxiety     Patient Active Problem List   Diagnosis Date Noted   Rubella non-immune status, antepartum 05/15/2022   Rh negative state in antepartum period 05/15/2022   Anemia 09/06/2020   Blood type, Rh negative 06/02/2020   Anxiety 12/21/2017    History reviewed. No pertinent surgical history.  OB History     Gravida  3   Para  2   Term  2   Preterm      AB  1   Living         SAB      IAB  1   Ectopic      Multiple      Live Births               Home Medications    Prior to Admission medications   Medication Sig Start Date End Date Taking? Authorizing Provider  acetaminophen (TYLENOL) 500 MG tablet Take 1,000 mg by mouth every 6 (six) hours as needed. 11/22/19  Yes [provider]  amoxicillin-clavulanate (AUGMENTIN) 875-125 MG tablet Take 1 tablet by mouth every 12 (twelve) hours. 01/11/24  Yes Tomi Bamberger, PA-C  azithromycin (ZITHROMAX) 250 MG tablet Take 1 tablet (250 mg total) by mouth daily. Take first 2 tablets together, then 1 every day until finished. 01/11/24  Yes Tomi Bamberger, PA-C  Ferrous Sulfate (IRON PO) Take 1 tablet by mouth daily. 09/06/20  Yes [provider]  ferrous sulfate 325 (65 FE) MG tablet Take 325 mg by mouth daily  with breakfast. 09/06/20  Yes [provider]  ibuprofen (ADVIL) 600 MG tablet Take 600 mg by mouth every 6 (six) hours as needed. 09/06/20  Yes [provider]  naproxen (NAPROSYN) 500 MG tablet Take 500 mg by mouth 2 (two) times daily with a meal. 11/22/19  Yes [provider]  ondansetron (ZOFRAN-ODT) 8 MG disintegrating tablet Take 8 mg by mouth every 8 (eight) hours as needed. 06/02/22  Yes [provider]  tiZANidine (ZANAFLEX) 4 MG tablet Take 4 mg by mouth every 8 (eight) hours as needed. 11/22/19  Yes [provider]  doxylamine, Sleep, (UNISOM) 25 MG tablet Take 1 tablet (25 mg total) by mouth 2 (two) times daily as needed (Nausea). Patient not taking: Reported on 06/28/2023 05/17/23   Rosezella Rumpf, PA-C  fluticasone [facility]) 50 MCG/ACT nasal spray Place 1 spray into both nostrils daily. 10/09/23   Gustavus Bryant, FNP  norethindrone-ethinyl estradiol-FE (JUNEL FE 1/20) 1-20 MG-MCG tablet Take 1 tablet by mouth daily. 08/04/23   Raelyn Mora, CNM  promethazine-dextromethorphan (PROMETHAZINE-DM) 6.25-15 MG/5ML syrup Take 5 mLs by mouth every 6 (six) hours as needed for cough. 10/09/23  [place], Williams E, [place]  Pyridoxine HCl 10 MG TABS Take 1 tablet by mouth 2 (two) times daily as needed (nausea). Patient not taking: Reported on 06/28/2023 05/17/23   Velda Shell    Family History History reviewed. No pertinent family history.  Social History Social History   Tobacco Use   Smoking status: Former    Current packs/day: 0.50    Types: Cigarettes   Smokeless tobacco: Never  Vaping Use   Vaping status: Never Used  Substance Use Topics   Alcohol use: No   Drug use: No     Allergies   Patient has no known allergies.   Review of Systems Review of Systems  Constitutional:  Negative for chills and fever.  HENT:  Positive for congestion and sore throat. Negative for ear pain.   Eyes:  Negative for discharge and redness.   Respiratory:  Positive for cough. Negative for shortness of breath and wheezing.   Gastrointestinal:  Negative for abdominal pain, diarrhea, nausea and vomiting.  Neurological:  Positive for headaches.     Physical Exam Triage Vital Signs ED Triage Vitals  Encounter Vitals Group     BP 01/11/24 1313 105/71     Systolic BP Percentile --      Diastolic BP Percentile --      Pulse Rate 01/11/24 1313 91     Resp 01/11/24 1313 18     Temp 01/11/24 1313 98.3 F (36.8 C)     Temp Source 01/11/24 1313 Oral     SpO2 01/11/24 1313 98 %     Weight --      Height --      Head Circumference --      Peak Flow --      Pain Score 01/11/24 1310 8     Pain Loc --      Pain Education --      Exclude from Growth Chart --    No data found.  Updated Vital Signs BP 105/71 (BP Location: Right Arm)   Pulse 91   Temp 98.3 F (36.8 C) (Oral)   Resp 18   LMP 12/27/2023   SpO2 98%   Visual Acuity Right Eye Distance:   Left Eye Distance:   Bilateral Distance:    Right Eye Near:   Left Eye Near:    Bilateral Near:     Physical Exam Vitals and nursing note reviewed.  Constitutional:      General: She is not in acute distress.    Appearance: Normal appearance. She is not ill-appearing.  HENT:     Head: Normocephalic and atraumatic.     Right Ear: Tympanic membrane normal.     Left Ear: Tympanic membrane normal.     Nose: Congestion present.     Mouth/Throat:     Mouth: Mucous membranes are moist.     Pharynx: No oropharyngeal exudate or posterior oropharyngeal erythema.  Eyes:     Conjunctiva/sclera: Conjunctivae normal.  Cardiovascular:     Rate and Rhythm: Normal rate and regular rhythm.     Heart sounds: Normal heart sounds. No murmur heard. Pulmonary:     Effort: Pulmonary effort is normal. No respiratory distress.     Breath sounds: Normal breath sounds. No wheezing, rhonchi or rales.  Skin:    General: Skin is warm and dry.  Neurological:     Mental Status: She is  alert.  Psychiatric:        Mood and Affect: Mood normal.  Thought Content: Thought content normal.      UC Treatments / Results  Labs (all labs ordered are listed, but only abnormal results are displayed) Labs Reviewed  POC COVID19/FLU A&B COMBO - Normal  POCT URINE PREGNANCY - Normal    EKG   Radiology No results found.  Procedures Procedures (including critical care time)  Medications Ordered in UC Medications - No data to display  Initial Impression / Assessment and Plan / UC Course  I have reviewed the triage vital signs and the nursing notes.  Pertinent labs & imaging results that were available during my care of the patient were reviewed by me and considered in my medical decision making (see chart for details).    Covid, flu screening negative. CXR is clear however will treat with antibiotic given duration of sinus issues and headache as well as persistent cough and questionable fever yesterday. Advised follow up if no improvement or with any further concerns.   Final Clinical Impressions(s) / UC Diagnoses   Final diagnoses:  Persistent cough  Sinusitis, unspecified chronicity, unspecified location   Discharge Instructions   None    ED Prescriptions     Medication Sig Dispense Auth. Provider   amoxicillin-clavulanate (AUGMENTIN) 875-125 MG tablet Take 1 tablet by mouth every 12 (twelve) hours. 14 tablet Erma Pinto F, PA-C   azithromycin (ZITHROMAX) 250 MG tablet Take 1 tablet (250 mg total) by mouth daily. Take first 2 tablets together, then 1 every day until finished. 6 tablet Tomi Bamberger, PA-C      PDMP not reviewed this encounter.   Tomi Bamberger, PA-C 01/15/24 [phone]

## 2024-02-26 ENCOUNTER — Ambulatory Visit

## 2024-03-18 ENCOUNTER — Ambulatory Visit: Admitting: Advanced Practice Midwife

## 2024-05-01 ENCOUNTER — Ambulatory Visit

## 2024-05-01 ENCOUNTER — Ambulatory Visit
Admission: RE | Admit: 2024-05-01 | Discharge: 2024-05-01 | Disposition: A | Discharge status: Home / Self Care | Source: Ambulatory Visit | Attending: Nurse Practitioner | Admitting: Nurse Practitioner

## 2024-05-01 VITALS — BP 116/80 | HR 91 | Temp 99.0°F | Resp 18

## 2024-05-01 DIAGNOSIS — Z113 Encounter for screening for infections with a predominantly sexual mode of transmission: Secondary | ICD-10-CM | POA: Insufficient documentation

## 2024-05-01 DIAGNOSIS — N898 Other specified noninflammatory disorders of vagina: Secondary | ICD-10-CM | POA: Diagnosis present

## 2024-05-01 LAB — POCT URINALYSIS DIP (MANUAL ENTRY)
Bilirubin, UA: NEGATIVE
Blood, UA: NEGATIVE
Glucose, UA: NEGATIVE mg/dL
Ketones, POC UA: NEGATIVE mg/dL
Leukocytes, UA: NEGATIVE
Nitrite, UA: NEGATIVE
Protein Ur, POC: 30 mg/dL — AB
Spec Grav, UA: 1.02 (ref 1.010–1.025)
Urobilinogen, UA: 1 U/dL
pH, UA: 8.5 — AB (ref 5.0–8.0)

## 2024-05-01 LAB — POCT URINE PREGNANCY: Preg Test, Ur: NEGATIVE

## 2024-05-01 MED ORDER — FLUCONAZOLE 150 MG PO TABS: 150.0000 mg | ORAL_TABLET | ORAL | 0 refills | Status: AC

## 2024-05-01 MED ORDER — METRONIDAZOLE 500 MG PO TABS: 500.0000 mg | ORAL_TABLET | Freq: Two times a day (BID) | ORAL | 0 refills | Status: AC

## 2024-05-01 NOTE — ED Triage Notes (Signed)
 Pt reports day 2 of dysuria, vaginal discharge and "bump down there". States "I know something is wrong."

## 2024-05-01 NOTE — Discharge Instructions (Addendum)
 You were seen today for vaginal discharge, which can occur when the normal balance of bacteria in the vagina changes. This can be influenced by a number of factors, including new or multiple sexual partners, unprotected sex, douching, smoking, certain antibiotics, or pregnancy. It's also possible to develop a vaginal infection even without being sexually active.  Tests were performed today to check for bacteria, yeast, gonorrhea, chlamydia, and trichomonas. While results are pending, treatment has been started for the most common non-STD causes--bacterial vaginosis and yeast infection--based on your symptoms and the absence of concern for a sexually transmitted infection.  During this time, avoid douching or using vaginal sprays or deodorants. Wear cotton or cotton-lined underwear to improve airflow and reduce moisture. If you are sexually active, using condoms or dental dams can help protect you. Make sure to stay hydrated by drinking plenty of fluids.  You will only be contacted if any of your test results are positive. You can also review your results through your MyChart account.

## 2024-05-01 NOTE — ED Provider Notes (Signed)
 [facility]    CSN: [id number] Arrival date & time: 05/01/24  1807      History   Chief Complaint Chief Complaint  Patient presents with   Exposure to STD    Entered by patient    HPI Angelica Dennis is a 28 y.o. female.   Angelica Dennis is a 28 y.o. female that presents with concerns of vaginal discharge and a painful bump on her left labia. She reports experiencing a "weird" feeling that led her to notice the symptoms. The vaginal discharge is described as milky in consistency, small in amount, and without any noticeable odor. The patient states she cannot see the bump clearly but can feel it when touching the area. The bump is located on the left labial and is painful to touch. The patient denies any associated symptoms such as vomiting, nausea, or abdominal pain. She also reports no oral lesions. The patient's last menstrual period started 04/13/24, which she states is consistent with her regular cycle occurring between the 17th and 18th of each month. She is not currently using birth control and reports having one female sexual partner in the past 3 months, with whom she always uses protection.  The following portions of the patient's history were reviewed and updated as appropriate: allergies, current medications, past family history, past medical history, past social history, past surgical history, and problem list     Past Medical History:  Diagnosis Date   Anxiety     Patient Active Problem List   Diagnosis Date Noted   Rubella non-immune status, antepartum 05/15/2022   Rh negative state in antepartum period 05/15/2022   Anemia 09/06/2020   Blood type, Rh negative 06/02/2020   Anxiety 12/21/2017    History reviewed. No pertinent surgical history.  OB History     Gravida  3   Para  2   Term  2   Preterm      AB  1   Living         SAB      IAB  1   Ectopic      Multiple      Live Births               Home Medications     Prior to Admission medications   Medication Sig Start Date End Date Taking? Authorizing Provider  acetaminophen  (TYLENOL ) 500 MG tablet Take 1,000 mg by mouth every 6 (six) hours as needed. 11/22/19  Yes [provider]  fluconazole (DIFLUCAN) 150 MG tablet Take 1 tablet (150 mg total) by mouth every 3 (three) days for 2 doses. 05/01/24 05/05/24 Yes June Rode, FNP  metroNIDAZOLE  (FLAGYL ) 500 MG tablet Take 1 tablet (500 mg total) by mouth 2 (two) times daily for 7 days. 05/01/24 05/08/24 Yes Maryruth Sol, FNP  azithromycin  (ZITHROMAX ) 250 MG tablet Take 1 tablet (250 mg total) by mouth daily. Take first 2 tablets together, then 1 every day until finished. Patient not taking: Reported on 05/01/2024 01/11/24   Vernestine Gondola, PA-C  doxylamine , Sleep, (UNISOM ) 25 MG tablet Take 1 tablet (25 mg total) by mouth 2 (two) times daily as needed (Nausea). Patient not taking: Reported on 06/28/2023 05/17/23   Kehrli, Kelsey F, PA-C  Ferrous Sulfate (IRON PO) Take 1 tablet by mouth daily. 09/06/20   [provider]  ferrous sulfate 325 (65 FE) MG tablet Take 325 mg by mouth daily with breakfast. 09/06/20   [provider]  fluticasone  (FLONASE ) 50  MCG/ACT nasal spray Place 1 spray into both nostrils daily. 10/09/23   Dodson Freestone, FNP  ibuprofen  (ADVIL ) 600 MG tablet Take 600 mg by mouth every 6 (six) hours as needed. 09/06/20   [provider]  naproxen (NAPROSYN) 500 MG tablet Take 500 mg by mouth 2 (two) times daily with a meal. 11/22/19   [provider]  norethindrone-ethinyl estradiol-FE (JUNEL FE 1/20) 1-20 MG-MCG tablet Take 1 tablet by mouth daily. 08/04/23   Dawson, Rolitta, CNM  ondansetron (ZOFRAN-ODT) 8 MG disintegrating tablet Take 8 mg by mouth every 8 (eight) hours as needed. 06/02/22   [provider]  Pyridoxine  HCl 10 MG TABS Take 1 tablet by mouth 2 (two) times daily as needed (nausea). Patient not taking: Reported on 06/28/2023 05/17/23    Kehrli, Kelsey F, PA-C  tiZANidine (ZANAFLEX) 4 MG tablet Take 4 mg by mouth every 8 (eight) hours as needed. 11/22/19   [provider]    Family History History reviewed. No pertinent family history.  Social History Social History   Tobacco Use   Smoking status: Former    Current packs/day: 0.50    Types: Cigarettes   Smokeless tobacco: Never  Vaping Use   Vaping status: Every Day  Substance Use Topics   Alcohol use: Yes    Comment: wine   Drug use: No     Allergies   Patient has no known allergies.   Review of Systems Review of Systems  HENT:  Negative for mouth sores.   Gastrointestinal:  Negative for nausea and vomiting.  Genitourinary:  Positive for dysuria, genital sores (feels like a bump on the left labia; tender) and vaginal discharge. Negative for menstrual problem (LMP: 04/13/24).  All other systems reviewed and are negative.    Physical Exam Triage Vital Signs ED Triage Vitals [05/01/24 1835]  Encounter Vitals Group     BP 116/80     Systolic BP Percentile      Diastolic BP Percentile      Pulse Rate 91     Resp 18     Temp 99 F (37.2 C)     Temp Source Oral     SpO2 97 %     Weight      Height      Head Circumference      Peak Flow      Pain Score      Pain Loc      Pain Education      Exclude from Growth Chart    No data found.  Updated Vital Signs BP 116/80 (BP Location: Right Arm)   Pulse 91   Temp 99 F (37.2 C) (Oral)   Resp 18   LMP 04/13/2024   SpO2 97%   Visual Acuity Right Eye Distance:   Left Eye Distance:   Bilateral Distance:    Right Eye Near:   Left Eye Near:    Bilateral Near:     Physical Exam Constitutional:      General: She is not in acute distress.    Appearance: Normal appearance. She is not ill-appearing, toxic-appearing or diaphoretic.  HENT:     Head: Normocephalic.     Nose: Nose normal.     Mouth/Throat:     Mouth: Mucous membranes are moist.  Eyes:     Conjunctiva/sclera:  Conjunctivae normal.  Cardiovascular:     Rate and Rhythm: Normal rate.  Pulmonary:     Effort: Pulmonary effort is normal.  Abdominal:  Palpations: Abdomen is soft.  Genitourinary:    Comments: Deferred; patient performed self-swab for Aptima testing  Musculoskeletal:        General: Normal range of motion.     Cervical back: Normal range of motion and neck supple.  Skin:    General: Skin is warm and dry.  Neurological:     General: No focal deficit present.     Mental Status: She is alert and oriented to person, place, and time.  Psychiatric:        Mood and Affect: Mood normal.        Behavior: Behavior normal.      UC Treatments / Results  Labs (all labs ordered are listed, but only abnormal results are displayed) Labs Reviewed  POCT URINALYSIS DIP (MANUAL ENTRY) - Abnormal; Notable for the following components:      Result Value   pH, UA 8.5 (*)    Protein Ur, POC =30 (*)    All other components within normal limits  POCT URINE PREGNANCY - Normal  CERVICOVAGINAL ANCILLARY ONLY    EKG   Radiology No results found.  Procedures Procedures (including critical care time)  Medications Ordered in UC Medications - No data to display  Initial Impression / Assessment and Plan / UC Course  I have reviewed the triage vital signs and the nursing notes.  Pertinent labs & imaging results that were available during my care of the patient were reviewed by me and considered in my medical decision making (see chart for details).     Patient presents with a complaint of vaginal discharge described as milk-like in consistency, small in amount, and without odor. She also reports a painful bump on the left vulvar. Her last menstrual period was on the 18th of this month. She has had one female sexual partner over the past three months with consistent condom use. Urine testing was negative for infection. A swab test was performed to evaluate for Bacterial Vaginosis, Chlamydia,  and Trichomoniasis, with results pending. Empiric treatment initiated with metronidazole  500 mg orally twice daily for 7 days, with counseling to avoid alcohol during treatment. Fluconazole 150 mg was also prescribed, with one dose taken today and a second dose in 3 days. Patient was advised to follow up if symptoms do not improve or if test results require a change in treatment.  Today's evaluation has revealed no signs of a dangerous process. Discussed diagnosis with patient and/or guardian. Patient and/or guardian aware of their diagnosis, possible red flag symptoms to watch out for and need for close follow up. Patient and/or guardian understands verbal and written discharge instructions. Patient and/or guardian comfortable with plan and disposition.  Patient and/or guardian has a clear mental status at this time, good insight into illness (after discussion and teaching) and has clear judgment to make decisions regarding their care  Documentation was completed with the aid of voice recognition software. Transcription may contain typographical errors.  Final Clinical Impressions(s) / UC Diagnoses   Final diagnoses:  Vaginal discharge  Screen for STD (sexually transmitted disease)     Discharge Instructions      You were seen today for vaginal discharge, which can occur when the normal balance of bacteria in the vagina changes. This can be influenced by a number of factors, including new or multiple sexual partners, unprotected sex, douching, smoking, certain antibiotics, or pregnancy. It's also possible to develop a vaginal infection even without being sexually active.  Tests were performed today to check  for bacteria, yeast, gonorrhea, chlamydia, and trichomonas. While results are pending, treatment has been started for the most common non-STD causes--bacterial vaginosis and yeast infection--based on your symptoms and the absence of concern for a sexually transmitted infection.  During this  time, avoid douching or using vaginal sprays or deodorants. Wear cotton or cotton-lined underwear to improve airflow and reduce moisture. If you are sexually active, using condoms or dental dams can help protect you. Make sure to stay hydrated by drinking plenty of fluids.  You will only be contacted if any of your test results are positive. You can also review your results through your MyChart account.   ED Prescriptions     Medication Sig Dispense Auth. Provider   fluconazole (DIFLUCAN) 150 MG tablet Take 1 tablet (150 mg total) by mouth every 3 (three) days for 2 doses. 2 tablet Maryruth Sol, FNP   metroNIDAZOLE  (FLAGYL ) 500 MG tablet Take 1 tablet (500 mg total) by mouth 2 (two) times daily for 7 days. 14 tablet Maryruth Sol, FNP      PDMP not reviewed this encounter.   Maryruth Sol, [place] 05/01/24 1944

## 2024-05-02 ENCOUNTER — Ambulatory Visit (HOSPITAL_COMMUNITY): Payer: Self-pay

## 2024-05-02 LAB — CERVICOVAGINAL ANCILLARY ONLY
Bacterial Vaginitis (gardnerella): POSITIVE — AB
Candida Glabrata: NEGATIVE
Candida Vaginitis: NEGATIVE
Chlamydia: NEGATIVE
Comment: NEGATIVE
Comment: NEGATIVE
Comment: NEGATIVE
Comment: NEGATIVE
Comment: NEGATIVE
Comment: NORMAL
Neisseria Gonorrhea: NEGATIVE
Trichomonas: NEGATIVE

## 2024-05-19 ENCOUNTER — Other Ambulatory Visit (HOSPITAL_COMMUNITY)
Admission: RE | Admit: 2024-05-19 | Discharge: 2024-05-19 | Disposition: A | Discharge status: Home / Self Care | Source: Ambulatory Visit | Attending: Advanced Practice Midwife | Admitting: Advanced Practice Midwife

## 2024-05-19 ENCOUNTER — Encounter: Payer: Self-pay | Admitting: Advanced Practice Midwife

## 2024-05-19 ENCOUNTER — Ambulatory Visit: Admitting: Advanced Practice Midwife

## 2024-05-19 VITALS — BP 117/65 | HR 75 | Ht 71.0 in | Wt 149.2 lb

## 2024-05-19 DIAGNOSIS — Z01419 Encounter for gynecological examination (general) (routine) without abnormal findings: Secondary | ICD-10-CM

## 2024-05-19 DIAGNOSIS — Z3042 Encounter for surveillance of injectable contraceptive: Secondary | ICD-10-CM

## 2024-05-19 DIAGNOSIS — Z113 Encounter for screening for infections with a predominantly sexual mode of transmission: Secondary | ICD-10-CM | POA: Diagnosis not present

## 2024-05-19 DIAGNOSIS — Z3009 Encounter for other general counseling and advice on contraception: Secondary | ICD-10-CM | POA: Diagnosis not present

## 2024-05-19 DIAGNOSIS — Z124 Encounter for screening for malignant neoplasm of cervix: Secondary | ICD-10-CM

## 2024-05-19 MED ORDER — MEDROXYPROGESTERONE ACETATE 150 MG/ML IM SUSP: 150.0000 mg | INTRAMUSCULAR | 4 refills | Status: AC

## 2024-05-19 MED ORDER — MEDROXYPROGESTERONE ACETATE 150 MG/ML IM SUSP
150.0000 mg | Freq: Once | INTRAMUSCULAR | Status: AC
  Administered 2024-05-19: 150 mg via INTRAMUSCULAR

## 2024-05-19 NOTE — Progress Notes (Signed)
 Pt presents for AEX.  Last PAP 02-10-20 Requesting STD testing  Interested in Depo. Last unprotected sex 3 weeks ago

## 2024-05-19 NOTE — Progress Notes (Signed)
 Subjective:     Angelica Dennis is a 28 y.o. female here at [facility] for a routine exam.  Current complaints: none.   Pt desires to start Depo Provera today.  Personal and family health history reviewed: yes.  Do you have a primary care provider? no Do you feel safe at home? yes  [employer] Visit from 05/19/2024 in [facility] for [facility] Total Score 0    Health Maintenance Due  Topic Date Due   HIV Screening  Never done   Hepatitis C Screening  Never done   DTaP/Tdap/Td (1 - Tdap) Never done   HPV VACCINES (1 - 3-dose SCDM series) Never done   COVID-19 Vaccine (1 - 2024-25 season) Never done   Cervical Cancer Screening (HPV/Pap Cotest)  Never done     Risk factors for chronic health problems: Smoking: former Alchohol/how much: social Pt BMI: Body mass index is 20.81 kg/m.   Gynecologic History Patient's last menstrual period was 05/10/2024. Contraception: none Last Pap: 2021. Results were: normal Last mammogram: n/a.  Obstetric History OB History  Gravida Para Term Preterm AB Living  3 2 2  1    SAB IAB Ectopic Multiple Live Births   1       # Outcome Date GA Lbr Len/2nd Weight Sex Type Anes PTL Lv  3 Term      Vag-Spont     2 Term      Vag-Spont     1 IAB              The following portions of the patient's history were reviewed and updated as appropriate: allergies, current medications, past family history, past medical history, past social history, past surgical history, and problem list.  Review of Systems A comprehensive review of systems was negative.    Objective:   Today's Vitals   05/19/24 1441  BP: 117/65  Pulse: 75  Weight: 149 lb 3.2 oz (67.7 kg)  Height: 5' 11 (1.803 m)   Body mass index is 20.81 kg/m.  VS reviewed, nursing note reviewed,  Constitutional: well developed, well nourished, no distress HEENT: normocephalic, thyroid without enlargement or mass HEART: RRR, no murmurs  rubs/gallops RESP: clear and equal to auscultation bilaterally in all lobes  Breast Exam: exam performed: right breast normal without mass, skin or nipple changes or axillary nodes, left breast normal without mass, skin or nipple changes or axillary nodes Abdomen: soft Neuro: alert and oriented x 3 Skin: warm, dry Psych: affect normal Pelvic exam: Cervix pink, visually closed, without lesion, scant white creamy discharge, vaginal walls and external genitalia normal Bimanual exam: Cervix 0/long/high, firm, anterior, neg CMT, uterus nontender, nonenlarged, adnexa without tenderness, enlargement, or mass        Assessment/Plan:   1. Encounter for annual routine gynecological examination   2. Screening examination for STI (Primary)  - HIV antibody (with reflex) - RPR - Hepatitis C Antibody - Hepatitis B Surface AntiGEN - Cervicovaginal ancillary only( [place])  3. Encounter for screening for cervical cancer  - Cytology - PAP( [place])    4. Encounter for counseling regarding contraception --Discussed pt contraceptive plans and reviewed contraceptive methods based on pt preferences and effectiveness.  Pt prefers to restart Depo.  - medroxyPROGESTERone (DEPO-PROVERA) injection 150 mg - medroxyPROGESTERone (DEPO-PROVERA) 150 MG/ML injection; Inject 1 mL (150 mg total) into the muscle every 3 (three) months.  Dispense: 1 mL; Refill: 4   Return in about  1 year (around 05/19/2025) for annual exam.   Olam Boards, CNM 3:54 PM

## 2024-05-20 LAB — RPR: RPR Ser Ql: NONREACTIVE

## 2024-05-20 LAB — CERVICOVAGINAL ANCILLARY ONLY
Chlamydia: NEGATIVE
Comment: NEGATIVE
Comment: NEGATIVE
Comment: NORMAL
Neisseria Gonorrhea: NEGATIVE
Trichomonas: NEGATIVE

## 2024-05-20 LAB — HEPATITIS B SURFACE ANTIGEN: Hepatitis B Surface Ag: NEGATIVE

## 2024-05-20 LAB — CYTOLOGY - PAP
Comment: NEGATIVE
Diagnosis: NEGATIVE
High risk HPV: NEGATIVE

## 2024-05-20 LAB — HIV ANTIBODY (ROUTINE TESTING W REFLEX): HIV Screen 4th Generation wRfx: NONREACTIVE

## 2024-05-20 LAB — HEPATITIS C ANTIBODY: Hep C Virus Ab: NONREACTIVE

## 2024-08-04 ENCOUNTER — Ambulatory Visit: Payer: Self-pay

## 2024-08-25 ENCOUNTER — Ambulatory Visit: Admitting: Advanced Practice Midwife

## 2024-08-26 ENCOUNTER — Ambulatory Visit: Admitting: Advanced Practice Midwife
